# Patient Record
Sex: Male | Born: 1960 | Race: White | Hispanic: No | Marital: Married | Smoking: Never smoker
Health system: Southern US, Community
[De-identification: ages and names within clinical notes are randomized; demographics above are authoritative.]

## PROBLEM LIST (undated history)

## (undated) DIAGNOSIS — R51 Headache: Secondary | ICD-10-CM

## (undated) DIAGNOSIS — R519 Headache, unspecified: Secondary | ICD-10-CM

## (undated) DIAGNOSIS — S3992XA Unspecified injury of lower back, initial encounter: Secondary | ICD-10-CM

## (undated) DIAGNOSIS — G4730 Sleep apnea, unspecified: Secondary | ICD-10-CM

## (undated) DIAGNOSIS — I10 Essential (primary) hypertension: Secondary | ICD-10-CM

## (undated) HISTORY — DX: Headache: R51

## (undated) HISTORY — DX: Sleep apnea, unspecified: G47.30

## (undated) HISTORY — DX: Headache, unspecified: R51.9

## (undated) HISTORY — DX: Unspecified injury of lower back, initial encounter: S39.92XA

## (undated) HISTORY — DX: Essential (primary) hypertension: I10

---

## 2002-10-27 ENCOUNTER — Encounter: Admission: RE | Admit: 2002-10-27 | Discharge: 2002-10-27 | Payer: Self-pay | Admitting: Internal Medicine

## 2002-10-27 ENCOUNTER — Encounter: Payer: Self-pay | Admitting: Internal Medicine

## 2005-07-07 ENCOUNTER — Ambulatory Visit (HOSPITAL_COMMUNITY): Admission: RE | Admit: 2005-07-07 | Discharge: 2005-07-07 | Payer: Self-pay | Admitting: Specialist

## 2005-08-11 ENCOUNTER — Ambulatory Visit (HOSPITAL_BASED_OUTPATIENT_CLINIC_OR_DEPARTMENT_OTHER): Admission: RE | Admit: 2005-08-11 | Discharge: 2005-08-11 | Payer: Self-pay | Admitting: Specialist

## 2005-09-03 ENCOUNTER — Encounter: Admission: RE | Admit: 2005-09-03 | Discharge: 2005-09-03 | Payer: Self-pay | Admitting: Internal Medicine

## 2006-04-28 HISTORY — PX: KNEE SURGERY: SHX244

## 2010-10-27 ENCOUNTER — Emergency Department (HOSPITAL_COMMUNITY): Payer: 59

## 2010-10-27 ENCOUNTER — Emergency Department (HOSPITAL_COMMUNITY)
Admission: EM | Admit: 2010-10-27 | Discharge: 2010-10-27 | Disposition: A | Discharge status: Home / Self Care | Payer: 59 | Attending: Emergency Medicine | Admitting: Emergency Medicine

## 2010-10-27 DIAGNOSIS — R109 Unspecified abdominal pain: Secondary | ICD-10-CM | POA: Insufficient documentation

## 2010-10-27 DIAGNOSIS — N200 Calculus of kidney: Secondary | ICD-10-CM | POA: Insufficient documentation

## 2010-10-27 DIAGNOSIS — N1330 Unspecified hydronephrosis: Secondary | ICD-10-CM | POA: Insufficient documentation

## 2010-10-27 DIAGNOSIS — N201 Calculus of ureter: Secondary | ICD-10-CM | POA: Insufficient documentation

## 2010-10-27 LAB — URINALYSIS, ROUTINE W REFLEX MICROSCOPIC: Specific Gravity, Urine: 1.028 (ref 1.005–1.030)

## 2010-10-27 LAB — URINE MICROSCOPIC-ADD ON

## 2015-02-11 ENCOUNTER — Encounter (HOSPITAL_COMMUNITY): Payer: Self-pay | Admitting: Emergency Medicine

## 2015-02-11 ENCOUNTER — Emergency Department (HOSPITAL_COMMUNITY): Payer: 59

## 2015-02-11 ENCOUNTER — Emergency Department (HOSPITAL_COMMUNITY)
Admission: EM | Admit: 2015-02-11 | Discharge: 2015-02-11 | Disposition: A | Discharge status: Home / Self Care | Payer: 59 | Attending: Emergency Medicine | Admitting: Emergency Medicine

## 2015-02-11 DIAGNOSIS — S199XXA Unspecified injury of neck, initial encounter: Secondary | ICD-10-CM | POA: Insufficient documentation

## 2015-02-11 DIAGNOSIS — S30811A Abrasion of abdominal wall, initial encounter: Secondary | ICD-10-CM | POA: Diagnosis not present

## 2015-02-11 DIAGNOSIS — Y92410 Unspecified street and highway as the place of occurrence of the external cause: Secondary | ICD-10-CM | POA: Insufficient documentation

## 2015-02-11 DIAGNOSIS — M25571 Pain in right ankle and joints of right foot: Secondary | ICD-10-CM

## 2015-02-11 DIAGNOSIS — Y9389 Activity, other specified: Secondary | ICD-10-CM | POA: Insufficient documentation

## 2015-02-11 DIAGNOSIS — Y998 Other external cause status: Secondary | ICD-10-CM | POA: Insufficient documentation

## 2015-02-11 DIAGNOSIS — S99911A Unspecified injury of right ankle, initial encounter: Secondary | ICD-10-CM | POA: Diagnosis not present

## 2015-02-11 DIAGNOSIS — S3992XA Unspecified injury of lower back, initial encounter: Secondary | ICD-10-CM | POA: Insufficient documentation

## 2015-02-11 DIAGNOSIS — M79641 Pain in right hand: Secondary | ICD-10-CM

## 2015-02-11 DIAGNOSIS — M545 Low back pain: Secondary | ICD-10-CM

## 2015-02-11 DIAGNOSIS — M542 Cervicalgia: Secondary | ICD-10-CM

## 2015-02-11 DIAGNOSIS — S6991XA Unspecified injury of right wrist, hand and finger(s), initial encounter: Secondary | ICD-10-CM | POA: Insufficient documentation

## 2015-02-11 MED ORDER — NAPROXEN 500 MG PO TABS: 500.0000 mg | ORAL_TABLET | Freq: Two times a day (BID) | ORAL | Status: DC

## 2015-02-11 MED ORDER — CYCLOBENZAPRINE HCL 10 MG PO TABS: 10.0000 mg | ORAL_TABLET | Freq: Two times a day (BID) | ORAL | Status: DC | PRN
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
## 2015-02-11 NOTE — Discharge Instructions (Signed)
Ankle Pain Ankle pain is a common symptom. The bones, cartilage, tendons, and muscles of the ankle joint perform a lot of work each day. The ankle joint holds your body weight and allows you to move around. Ankle pain can occur on either side or back of 1 or both ankles. Ankle pain may be sharp and burning or dull and aching. There may be tenderness, stiffness, redness, or warmth around the ankle. The pain occurs more often when a person walks or puts pressure on the ankle. CAUSES  There are many reasons ankle pain can develop. It is important to work with your caregiver to identify the cause since many conditions can impact the bones, cartilage, muscles, and tendons. Causes for ankle pain include:  Injury, including a break (fracture), sprain, or strain often due to a fall, sports, or a high-impact activity.  Swelling (inflammation) of a tendon (tendonitis).  Achilles tendon rupture.  Ankle instability after repeated sprains and strains.  Poor foot alignment.  Pressure on a nerve (tarsal tunnel syndrome).  Arthritis in the ankle or the lining of the ankle.  Crystal formation in the ankle (gout or pseudogout). DIAGNOSIS  A diagnosis is based on your medical history, your symptoms, results of your physical exam, and results of diagnostic tests. Diagnostic tests may include X-ray exams or a computerized magnetic scan (magnetic resonance imaging, MRI). TREATMENT  Treatment will depend on the cause of your ankle pain and may include:  Keeping pressure off the ankle and limiting activities.  Using crutches or other walking support (a cane or brace).  Using rest, ice, compression, and elevation.  Participating in physical therapy or home exercises.  Wearing shoe inserts or special shoes.  Losing weight.  Taking medications to reduce pain or swelling or receiving an injection.  Undergoing surgery. HOME CARE INSTRUCTIONS   Only take over-the-counter or prescription medicines for  pain, discomfort, or fever as directed by your caregiver.  Put ice on the injured area.  Put ice in a plastic bag.  Place a towel between your skin and the bag.  Leave the ice on for 15-20 minutes at a time, 03-04 times a day.  Keep your leg raised (elevated) when possible to lessen swelling.  Avoid activities that cause ankle pain.  Follow specific exercises as directed by your caregiver.  Record how often you have ankle pain, the location of the pain, and what it feels like. This information may be helpful to you and your caregiver.  Ask your caregiver about returning to work or sports and whether you should drive.  Follow up with your caregiver for further examination, therapy, or testing as directed. SEEK MEDICAL CARE IF:   Pain or swelling continues or worsens beyond 1 week.  You have an oral temperature above 102 F (38.9 C).  You are feeling unwell or have chills.  You are having an increasingly difficult time with walking.  You have loss of sensation or other new symptoms.  You have questions or concerns. MAKE SURE YOU:   Understand these instructions.  Will watch your condition.  Will get help right away if you are not doing well or get worse.   This information is not intended to replace advice given to you by your health care provider. Make sure you discuss any questions you have with your health care provider.   Document Released: 10/02/2009 Document Revised: 07/07/2011 Document Reviewed: 11/14/2014 Elsevier Interactive Patient Education 2016 [employer]  Back Pain, Adult Back pain is very common  in adults.The cause of back pain is rarely dangerous and the pain often gets better over time.The cause of your back pain may not be known. Some common causes of back pain include:  Strain of the muscles or ligaments supporting the spine.  Wear and tear (degeneration) of the spinal disks.  Arthritis.  Direct injury to the back. For many people, back  pain may return. Since back pain is rarely dangerous, most people can learn to manage this condition on their own. HOME CARE INSTRUCTIONS Watch your back pain for any changes. The following actions may help to lessen any discomfort you are feeling:  Remain active. It is stressful on your back to sit or stand in one place for long periods of time. Do not sit, drive, or stand in one place for more than 30 minutes at a time. Take short walks on even surfaces as soon as you are able.Try to increase the length of time you walk each day.  Exercise regularly as directed by your health care provider. Exercise helps your back heal faster. It also helps avoid future injury by keeping your muscles strong and flexible.  Do not stay in bed.Resting more than 1-2 days can delay your recovery.  Pay attention to your body when you bend and lift. The most comfortable positions are those that put less stress on your recovering back. Always use proper lifting techniques, including:  Bending your knees.  Keeping the load close to your body.  Avoiding twisting.  Find a comfortable position to sleep. Use a firm mattress and lie on your side with your knees slightly bent. If you lie on your back, put a pillow under your knees.  Avoid feeling anxious or stressed.Stress increases muscle tension and can worsen back pain.It is important to recognize when you are anxious or stressed and learn ways to manage it, such as with exercise.  Take medicines only as directed by your health care provider. Over-the-counter medicines to reduce pain and inflammation are often the most helpful.Your health care provider may prescribe muscle relaxant drugs.These medicines help dull your pain so you can more quickly return to your normal activities and healthy exercise.  Apply ice to the injured area:  Put ice in a plastic bag.  Place a towel between your skin and the bag.  Leave the ice on for 20 minutes, 2-3 times a day for  the first 2-3 days. After that, ice and heat may be alternated to reduce pain and spasms.  Maintain a healthy weight. Excess weight puts extra stress on your back and makes it difficult to maintain good posture. SEEK MEDICAL CARE IF:  You have pain that is not relieved with rest or medicine.  You have increasing pain going down into the legs or buttocks.  You have pain that does not improve in one week.  You have night pain.  You lose weight.  You have a fever or chills. SEEK IMMEDIATE MEDICAL CARE IF:   You develop new bowel or bladder control problems.  You have unusual weakness or numbness in your arms or legs.  You develop nausea or vomiting.  You develop abdominal pain.  You feel faint.   This information is not intended to replace advice given to you by your health care provider. Make sure you discuss any questions you have with your health care provider.   Document Released: 04/14/2005 Document Revised: 05/05/2014 Document Reviewed: 08/16/2013 Elsevier Interactive Patient Education 2016 [employer].  Technical brewer It is  common to have multiple bruises and sore muscles after a motor vehicle collision (MVC). These tend to feel worse for the first 24 hours. You may have the most stiffness and soreness over the first several hours. You may also feel worse when you wake up the first morning after your collision. After this point, you will usually begin to improve with each day. The speed of improvement often depends on the severity of the collision, the number of injuries, and the location and nature of these injuries. HOME CARE INSTRUCTIONS  Put ice on the injured area.  Put ice in a plastic bag.  Place a towel between your skin and the bag.  Leave the ice on for 15-20 minutes, 3-4 times a day, or as directed by your health care provider.  Drink enough fluids to keep your urine clear or pale yellow. Do not drink alcohol.  Take a warm shower or bath once  or twice a day. This will increase blood flow to sore muscles.  You may return to activities as directed by your caregiver. Be careful when lifting, as this may aggravate neck or back pain.  Only take over-the-counter or prescription medicines for pain, discomfort, or fever as directed by your caregiver. Do not use aspirin. This may increase bruising and bleeding. SEEK IMMEDIATE MEDICAL CARE IF:  You have numbness, tingling, or weakness in the arms or legs.  You develop severe headaches not relieved with medicine.  You have severe neck pain, especially tenderness in the middle of the back of your neck.  You have changes in bowel or bladder control.  There is increasing pain in any area of the body.  You have shortness of breath, light-headedness, dizziness, or fainting.  You have chest pain.  You feel sick to your stomach (nauseous), throw up (vomit), or sweat.  You have increasing abdominal discomfort.  There is blood in your urine, stool, or vomit.  You have pain in your shoulder (shoulder strap areas).  You feel your symptoms are getting worse. MAKE SURE YOU:  Understand these instructions.  Will watch your condition.  Will get help right away if you are not doing well or get worse.   This information is not intended to replace advice given to you by your health care provider. Make sure you discuss any questions you have with your health care provider.   Document Released: 04/14/2005 Document Revised: 05/05/2014 Document Reviewed: 09/11/2010 Elsevier Interactive Patient Education 2016 Elsevier Inc.  Musculoskeletal Pain Musculoskeletal pain is muscle and boney aches and pains. These pains can occur in any part of the body. Your caregiver may treat you without knowing the cause of the pain. They may treat you if blood or urine tests, X-rays, and other tests were normal.  CAUSES There is often not a definite cause or reason for these pains. These pains may be caused by  a type of germ (virus). The discomfort may also come from overuse. Overuse includes working out too hard when your body is not fit. Boney aches also come from weather changes. Bone is sensitive to atmospheric pressure changes. HOME CARE INSTRUCTIONS   Ask when your test results will be ready. Make sure you get your test results.  Only take over-the-counter or prescription medicines for pain, discomfort, or fever as directed by your caregiver. If you were given medications for your condition, do not drive, operate machinery or power tools, or sign legal documents for 24 hours. Do not drink alcohol. Do not take sleeping pills  or other medications that may interfere with treatment.  Continue all activities unless the activities cause more pain. When the pain lessens, slowly resume normal activities. Gradually increase the intensity and duration of the activities or exercise.  During periods of severe pain, bed rest may be helpful. Lay or sit in any position that is comfortable.  Putting ice on the injured area.  Put ice in a bag.  Place a towel between your skin and the bag.  Leave the ice on for 15 to 20 minutes, 3 to 4 times a day.  Follow up with your caregiver for continued problems and no reason can be found for the pain. If the pain becomes worse or does not go away, it may be necessary to repeat tests or do additional testing. Your caregiver may need to look further for a possible cause. SEEK IMMEDIATE MEDICAL CARE IF:  You have pain that is getting worse and is not relieved by medications.  You develop chest pain that is associated with shortness or breath, sweating, feeling sick to your stomach (nauseous), or throw up (vomit).  Your pain becomes localized to the abdomen.  You develop any new symptoms that seem different or that concern you. MAKE SURE YOU:   Understand these instructions.  Will watch your condition.  Will get help right away if you are not doing well or get  worse.   This information is not intended to replace advice given to you by your health care provider. Make sure you discuss any questions you have with your health care provider.   Document Released: 04/14/2005 Document Revised: 07/07/2011 Document Reviewed: 12/17/2012 Elsevier Interactive Patient Education [employer].

## 2015-02-11 NOTE — ED Notes (Signed)
Discharge instructions/prescription discussed with patient/family. Understanding verbalized. Patient declined wheelchair at time of discharge. No acute distress noted.
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
## 2015-03-01 ENCOUNTER — Ambulatory Visit: Payer: 59 | Attending: Orthopaedic Surgery | Admitting: Physical Therapy

## 2015-03-01 DIAGNOSIS — R293 Abnormal posture: Secondary | ICD-10-CM | POA: Diagnosis present

## 2015-03-01 DIAGNOSIS — M62830 Muscle spasm of back: Secondary | ICD-10-CM | POA: Diagnosis present

## 2015-03-01 DIAGNOSIS — M545 Low back pain, unspecified: Secondary | ICD-10-CM

## 2015-03-01 DIAGNOSIS — M2560 Stiffness of unspecified joint, not elsewhere classified: Secondary | ICD-10-CM | POA: Insufficient documentation

## 2015-03-01 DIAGNOSIS — M5386 Other specified dorsopathies, lumbar region: Secondary | ICD-10-CM

## 2015-03-01 NOTE — Patient Instructions (Signed)
   Edilia Ghuman PT, DPT, LAT, ATC  [facility] Phone: [phone]     
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
## 2015-03-09 ENCOUNTER — Ambulatory Visit: Payer: 59 | Admitting: Physical Therapy

## 2015-03-09 DIAGNOSIS — M545 Low back pain, unspecified: Secondary | ICD-10-CM

## 2015-03-09 DIAGNOSIS — M5386 Other specified dorsopathies, lumbar region: Secondary | ICD-10-CM

## 2015-03-09 DIAGNOSIS — M62830 Muscle spasm of back: Secondary | ICD-10-CM

## 2015-03-09 DIAGNOSIS — R293 Abnormal posture: Secondary | ICD-10-CM

## 2015-03-09 NOTE — Patient Instructions (Addendum)
Sleeping on Back   Place pillow under knees. A pillow with cervical support and a roll around waist are also helpful. Copyright  VHI. All rights reserved.  Sleeping on Side Place pillow between knees. Use cervical support under neck and a roll around waist as needed. Copyright  VHI. All rights reserved.   Sleeping on Stomach   If this is the only desirable sleeping position, place pillow under lower legs, and under stomach or chest as needed.  Posture - Sitting   Sit upright, head facing forward. Try using a roll to support lower back. Keep shoulders relaxed, and avoid rounded back. Keep hips level with knees. Avoid crossing legs for long periods. Stand to Sit / Sit to Stand   To sit: Bend knees to lower self onto front edge of chair, then scoot back on seat. To stand: Reverse sequence by placing one foot forward, and scoot to front of seat. Use rocking motion to stand up.   Work Height and Reach  Ideal work height is no more than 2 to 4 inches below elbow level when standing, and at elbow level when sitting. Reaching should be limited to arm's length, with elbows slightly bent.  Bending  Bend at hips and knees, not back. Keep feet shoulder-width apart.    Posture - Standing   Good posture is important. Avoid slouching and forward head thrust. Maintain curve in low back and align ears over shoul- ders, hips over ankles.  Alternating Positions   Alternate tasks and change positions frequently to reduce fatigue and muscle tension. Take rest breaks. Computer Work   Position work to Programmer, multimedia. Use proper work and seat height. Keep shoulders back and down, wrists straight, and elbows at right angles. Use chair that provides full back support. Add footrest and lumbar roll as needed.  Getting Into / Out of Car  Lower self onto seat, scoot back, then bring in one leg at a time. Reverse sequence to get out.  Dressing  Lie on back to pull socks or slacks over feet, or sit  and bend leg while keeping back straight.    Housework - Sink  Place one foot on ledge of cabinet under sink when standing at sink for prolonged periods.   Pushing / Pulling  Pushing is preferable to pulling. Keep back in proper alignment, and use leg muscles to do the work.  Deep Squat   Squat and lift with both arms held against upper trunk. Tighten stomach muscles without holding breath. Use smooth movements to avoid jerking.  Avoid Twisting   Avoid twisting or bending back. Pivot around using foot movements, and bend at knees if needed when reaching for articles.  Carrying Luggage   Distribute weight evenly on both sides. Use a cart whenever possible. Do not twist trunk. Move body as a unit.   Lifting Principles .Maintain proper posture and head alignment. .Slide object as close as possible before lifting. .Move obstacles out of the way. .Test before lifting; ask for help if too heavy. .Tighten stomach muscles without holding breath. .Use smooth movements; do not jerk. .Use legs to do the work, and pivot with feet. .Distribute the work load symmetrically and close to the center of trunk. .Push instead of pull whenever possible.   Ask For Help   Ask for help and delegate to others when possible. Coordinate your movements when lifting together, and maintain the low back curve.  Log Roll   Lying on back, bend left knee and place  arm across chest. Roll all in one movement to the right. Reverse to roll to the left. Always move as one unit. Housework - Sweeping  Use long-handled equipment to avoid stooping.   Housework - Wiping  Position yourself as close as possible to reach work surface. Avoid straining your back.  Laundry - Unloading Wash   To unload small items at bottom of washer, lift leg opposite to arm being used to reach.  Gardening - Raking  Move close to area to be raked. Use arm movements to do the work. Keep back straight and avoid  twisting.     Cart  When reaching into cart with one arm, lift opposite leg to keep back straight.   Getting Into / Out of Bed  Lower self to lie down on one side by raising legs and lowering head at the same time. Use arms to assist moving without twisting. Bend both knees to roll onto back if desired. To sit up, start from lying on side, and use same move-ments in reverse. Housework - Vacuuming  Hold the vacuum with arm held at side. Step back and forth to move it, keeping head up. Avoid twisting.   Laundry - Loading Wash  Position laundry basket so that bending and twisting can be avoided.   Laundry - Unloading Dryer  Squat down to reach into clothes dryer or use a reacher.  Gardening - Weeding / Planting  Squat or Kneel. Knee pads may be helpful.                    Trigger Point Dry Needling  . What is Trigger Point Dry Needling (DN)? o DN is a physical therapy technique used to treat muscle pain and dysfunction. Specifically, DN helps deactivate muscle trigger points (muscle knots).  o A thin filiform needle is used to penetrate the skin and stimulate the underlying trigger point. The goal is for a local twitch response (LTR) to occur and for the trigger point to relax. No medication of any kind is injected during the procedure.   . What Does Trigger Point Dry Needling Feel Like?  o The procedure feels different for each individual patient. Some patients report that they do not actually feel the needle enter the skin and overall the process is not painful. Very mild bleeding may occur. However, many patients feel a deep cramping in the muscle in which the needle was inserted. This is the local twitch response.   . How Will I feel after the treatment? o Soreness is normal, and the onset of soreness may not occur for a few hours. Typically this soreness does not last longer than two days.  o Bruising is uncommon, however; ice can be used to decrease any  possible bruising.  o In rare cases feeling tired or nauseous after the treatment is normal. In addition, your symptoms may get worse before they get better, this period will typically not last longer than 24 hours.   . What Can I do After My Treatment? o Increase your hydration by drinking more water for the next 24 hours. o You may place ice or heat on the areas treated that have become sore, however, do not use heat on inflamed or bruised areas. Heat often brings more relief post needling. o You can continue your regular activities, but vigorous activity is not recommended initially after the treatment for 24 hours. o DN is best combined with other physical therapy such as strengthening, stretching, and   other therapies.   Adam Hubbard PT, DPT, LAT, ATC  [facility] Phone: [phone]     

## 2015-03-09 NOTE — Therapy (Signed)
[place], [place], [address] Phone: [phone]   Fax:  [phone]  Physical Therapy Treatment  Patient Details  Name: Adam Hubbard MRN: [id number] Date of Birth: 1960-07-17 Referring Provider: Dr. Jean Rosenthal  Encounter Date: 03/09/2015      PT End of Session - 03/09/15 0948    Visit Number 2   Number of Visits 12   Date for PT Re-Evaluation 04/12/15   PT Start Time 0845   PT Stop Time 0940   PT Time Calculation (min) 55 min   Activity Tolerance Patient tolerated treatment well   Behavior During Therapy [facility] for tasks assessed/performed      No past medical history on file.  Past Surgical History  Procedure Laterality Date  . Knee surgery      There were no vitals filed for this visit.  Visit Diagnosis:  Left-sided low back pain without sciatica  Decreased ROM of lumbar spine  Back muscle spasm  Abnormal posture      Subjective Assessment - 03/09/15 0848    Subjective "i am feeling sore today," pt reports that he has been consistent with his HEP   Currently in Pain? Yes   Pain Score 3    Pain Location Back   Pain Orientation Lower;Mid   Pain Descriptors / Indicators Aching;Sharp   Pain Type Chronic pain   Pain Onset More than a month ago   Pain Frequency Intermittent   Aggravating Factors  laying down, or sleeping, bending forward   Pain Relieving Factors nothing seems to help                         [facility] Adult PT Treatment/Exercise - 03/09/15 0859    Self-Care   Self-Care Posture   Posture educated on proper spinal positions and how to maintain proper spinal curves throughout daily activities.    Lumbar Exercises: Stretches   Passive Hamstring Stretch 2 reps;30 seconds   Lower Trunk Rotation 4 reps;20 seconds   Lumbar Exercises: Aerobic   Stationary Bike Nu-Step L5 x 8 min   Lumbar Exercises: Standing   Other Standing Lumbar Exercises rhomboid  stretch from door 2 x 30 sec   Lumbar Exercises: Supine   Ab Set 10 reps;3 seconds   MOIST HEAT PACK Lumbar spine 10 minutes, in supine    Bent Knee Raise 15 reps  VC for abdominal draw in manuever                   Trigger Point Dry Needling - 03/09/15 0917    Consent Given? Yes   Education Handout Provided Yes   Muscles Treated Upper Body --  L lumbar multifidus              PT Education - 03/09/15 0948    Education provided Yes   Education Details dry needling education, posture education    Person(s) Educated Patient   Methods Explanation;Handout   Comprehension Verbalized understanding          PT Short Term Goals - 03/01/15 1220    PT SHORT TERM GOAL #1   Title pt will be I with inital HEP (03/22/2015)   Time 3   Period Weeks   Status New           PT Long Term Goals - 03/01/15 1221    PT LONG TERM GOAL #1   Title pt will be I with all  HEP as of last visit (04/12/2015)   Time 6   Period Weeks   Status New   PT LONG TERM GOAL #2   Title pt will demonstrate decreased low back muscle spasme to assist with improved trunk mobility (04/12/2015)   Time 6   Period Weeks   Status New   PT LONG TERM GOAL #3   Title pt will improve trunk mobility to WNL in all planes with <2/10 pain during and following movement to help with job related activities (04/12/2015)   Time 6   Period Weeks   Status New   PT LONG TERM GOAL #4   Title pt will be able to verbalize and demonstrate techniques to reduce low back pain and reinjury via postural awareness, lifting and carrying mechanics and HEP (04/12/2015)   Time 6   Period Weeks   Status New   PT LONG TERM GOAL #5   Title pt will increase his FOTO score to > 70 to demosntrate improved function at discharge (04/12/2015)   Time [place] - 03/09/15 0949    Clinical Impression Statement Adam Hubbard reports that he has been consistent with his HEP but still has  some low back soreness. He provided consent for dry needling of the L lumbar multifidus which deep ache was elicited and he reported increased trunk mobility after. He stated instrument assisted STM helped decrease the ache and was able to complete all exercises given without complaint of increased pain or symtpoms.    PT Next Visit Plan assess response to dry needling, stretching, core strengthening,  STM for parapsinals, MHP,    PT Home Exercise Plan posture education   Consulted and Agree with Plan of Care Patient        Problem List There are no active problems to display for this patient.  [name] PT, DPT, LAT, ATC  03/09/2015  9:52 AM      [facility] [address] [place], [place], [address] Phone: [phone]   Fax:  [phone]  Name: Adam Hubbard MRN: [id number] Date of Birth: [birth year]

## 2015-03-14 ENCOUNTER — Ambulatory Visit: Payer: 59 | Admitting: Physical Therapy

## 2015-03-14 DIAGNOSIS — M62830 Muscle spasm of back: Secondary | ICD-10-CM

## 2015-03-14 DIAGNOSIS — M5386 Other specified dorsopathies, lumbar region: Secondary | ICD-10-CM

## 2015-03-14 DIAGNOSIS — M545 Low back pain, unspecified: Secondary | ICD-10-CM

## 2015-03-14 DIAGNOSIS — R293 Abnormal posture: Secondary | ICD-10-CM

## 2015-03-14 NOTE — Therapy (Signed)
[place], [place], [address] Phone: [phone]   Fax:  [phone]  Physical Therapy Treatment  Patient Details  Name: Adam Hubbard MRN: [id number] Date of Birth: January 07, 1961 Referring Provider: Dr. Jean Rosenthal  Encounter Date: 03/14/2015      PT End of Session - 03/14/15 0924    Visit Number 3   Number of Visits 12   Date for PT Re-Evaluation 04/12/15   PT Start Time 0804   PT Stop Time 0852   PT Time Calculation (min) 48 min   Activity Tolerance Patient tolerated treatment well;Patient limited by pain  multifitus press painful   Behavior During Therapy [facility] for tasks assessed/performed      No past medical history on file.  Past Surgical History  Procedure Laterality Date  . Knee surgery      There were no vitals filed for this visit.  Visit Diagnosis:  Left-sided low back pain without sciatica  Decreased ROM of lumbar spine  Back muscle spasm  Abnormal posture      Subjective Assessment - 03/14/15 0809    Subjective 3/10 low back Dry needle seemed to help some but pain returned over the weekend.  Sometime s it feels like Pt is helping and then it feels like it is not.  Neck gets stiff too when back is hurting.    Currently in Pain? Yes   Pain Score 3    Pain Location Back   Pain Orientation Lower   Pain Descriptors / Indicators Aching   Pain Frequency Constant  varies  2to 5/10   Aggravating Factors  Back wakes him up.  Manually loading truck.Sitting a loy   Pain Relieving Factors moving around.                          [facility] Adult PT Treatment/Exercise - 03/14/15 0817    Self-Care   Self-Care --  beginning education.  Pamphlet printed but patient left here   Lumbar Exercises: Stretches   Passive Hamstring Stretch 3 reps;30 seconds   Lower Trunk Rotation 4 reps;20 seconds   Lumbar Exercises: Supine   Other Supine Lumbar Exercises Leg lengthener 5  X 5 seconds each side leg press 5 X 5 seconds each, 2 sets.   Lumbar Exercises: Prone   Other Prone Lumbar Exercises multifitus press into 2 pillows increased low back pain so stopped   Manual Therapy   Manual therapy comments Lt paraspinals higher than RT, fascia tight superficial, Rock blade used to help relax low back.  kinesiotex tapind distal to proximal paraspinals.                    PT Short Term Goals - 03/14/15 [id number]    PT SHORT TERM GOAL #1   Title pt will be I with inital HEP (03/22/2015)   Baseline minor cues   Time 3   Period Weeks   Status On-going           PT Long Term Goals - 03/01/15 1221    PT LONG TERM GOAL #1   Title pt will be I with all HEP as of last visit (04/12/2015)   Time 6   Period Weeks   Status New   PT LONG TERM GOAL #2   Title pt will demonstrate decreased low back muscle spasme to assist with improved trunk mobility (04/12/2015)   Time 6   Period Weeks  Status New   PT LONG TERM GOAL #3   Title pt will improve trunk mobility to WNL in all planes with <2/10 pain during and following movement to help with job related activities (04/12/2015)   Time 6   Period Weeks   Status New   PT LONG TERM GOAL #4   Title pt will be able to verbalize and demonstrate techniques to reduce low back pain and reinjury via postural awareness, lifting and carrying mechanics and HEP (04/12/2015)   Time 6   Period Weeks   Status New   PT LONG TERM GOAL #5   Title pt will increase his FOTO score to > 70 to demosntrate improved function at discharge (04/12/2015)   Time [place] - 03/14/15 0925    Clinical Impression Statement Patient stiff.  Needs to get his posture/ADL handout.  Progress toward pain goals temporary.   PT Next Visit Plan Dry Needle?  assess taping, posture handout and review if time, myofascial release   PT Home Exercise Plan continue stretching   Consulted and Agree with Plan of  Care Patient        Problem List There are no active problems to display for this patient.   [facility] 03/14/2015, 9:29 AM  [facility] [address] [place], [place], [address] Phone: [phone]   Fax:  [phone]  Name: Adam Hubbard MRN: [id number] Date of Birth: December 18, 1960    Melvenia Needles, PTA 03/14/2015 9:29 AM Phone: [phone] Fax: [phone]

## 2015-03-14 NOTE — Patient Instructions (Addendum)

## 2015-03-16 ENCOUNTER — Ambulatory Visit: Payer: 59 | Admitting: Physical Therapy

## 2015-03-16 DIAGNOSIS — M62830 Muscle spasm of back: Secondary | ICD-10-CM

## 2015-03-16 DIAGNOSIS — M545 Low back pain, unspecified: Secondary | ICD-10-CM

## 2015-03-16 DIAGNOSIS — R293 Abnormal posture: Secondary | ICD-10-CM

## 2015-03-16 DIAGNOSIS — M5386 Other specified dorsopathies, lumbar region: Secondary | ICD-10-CM

## 2015-03-16 NOTE — Therapy (Signed)
[place], [place], [address] Phone: [phone]   Fax:  [phone]  Physical Therapy Treatment  Patient Details  Name: Adam Hubbard MRN: [id number] Date of Birth: 1960/12/25 Referring Provider: Dr. Jean Rosenthal  Encounter Date: 03/16/2015      PT End of Session - 03/16/15 0837    Visit Number 4   Number of Visits 12   Date for PT Re-Evaluation 04/12/15   PT Start Time 0800   PT Stop Time 0900   PT Time Calculation (min) 60 min   Activity Tolerance Patient tolerated treatment well   Behavior During Therapy [facility] for tasks assessed/performed      No past medical history on file.  Past Surgical History  Procedure Laterality Date  . Knee surgery      There were no vitals filed for this visit.  Visit Diagnosis:  Decreased ROM of lumbar spine  Left-sided low back pain without sciatica  Back muscle spasm  Abnormal posture      Subjective Assessment - 03/16/15 0803    Subjective "Today things are going pretty good, still have some tightness but today its more mild" pt reports it is difficult to tell if the tape has helped.    Currently in Pain? Yes   Pain Score 3    Pain Location Back   Pain Orientation Lower   Pain Descriptors / Indicators Aching   Pain Type Chronic pain                         [facility] Adult PT Treatment/Exercise - 03/16/15 0001    Lumbar Exercises: Stretches   Passive Hamstring Stretch 3 reps;30 seconds  PNF contract relax with 10 sec contraction   Lower Trunk Rotation 3 reps;20 seconds   Lumbar Exercises: Aerobic   Stationary Bike Nu-Step L5 x 8 min   Lumbar Exercises: Standing   Other Standing Lumbar Exercises rhomboid stretch from door 2 x 30 sec   Lumbar Exercises: Supine   Bent Knee Raise 15 reps  with abdominal draw in manuever   Lumbar Exercises: Prone   Opposite Arm/Leg Raise Right arm/Left leg;Left arm/Right leg;10 reps  with  transverse abdominis contraction, on stomach   Other Prone Lumbar Exercises --   Modalities   Modalities Moist Heat;Electrical Stimulation   Moist Heat Therapy   Number Minutes Moist Heat 15 Minutes   Moist Heat Location Lumbar Spine  in supine   Manual Therapy   Manual Therapy Joint mobilization;Myofascial release;Soft tissue mobilization   Joint Mobilization grade 2-3 lumbar P>A joint mobs, and unilateral mobs   Soft tissue mobilization instrument assisted STM over bil lumbar parapsinals   Myofascial Release slow myofascial stretchinging with cross arm techniques          Trigger Point Dry Needling - 03/16/15 0805    Consent Given? Yes   Education Handout Provided Yes   Muscles Treated Upper Body Longissimus  lumbar multifiuds   Longissimus Response Twitch response elicited;Palpable increased muscle length              PT Education - 03/16/15 0837    Education provided Yes   Education Details e-stim   Person(s) Educated Patient   Methods Explanation   Comprehension Verbalized understanding          PT Short Term Goals - 03/14/15 0928    PT SHORT TERM GOAL #1   Title pt will be I with inital HEP (  03/22/2015)   Baseline minor cues   Time 3   Period Weeks   Status On-going           PT Long Term Goals - 03/01/15 1221    PT LONG TERM GOAL #1   Title pt will be I with all HEP as of last visit (04/12/2015)   Time 6   Period Weeks   Status New   PT LONG TERM GOAL #2   Title pt will demonstrate decreased low back muscle spasme to assist with improved trunk mobility (04/12/2015)   Time 6   Period Weeks   Status New   PT LONG TERM GOAL #3   Title pt will improve trunk mobility to WNL in all planes with <2/10 pain during and following movement to help with job related activities (04/12/2015)   Time 6   Period Weeks   Status New   PT LONG TERM GOAL #4   Title pt will be able to verbalize and demonstrate techniques to reduce low back pain and reinjury  via postural awareness, lifting and carrying mechanics and HEP (04/12/2015)   Time 6   Period Weeks   Status New   PT LONG TERM GOAL #5   Title pt will increase his FOTO score to > 70 to demosntrate improved function at discharge (04/12/2015)   Time [place] - 03/16/15 0838    Clinical Impression Statement Micahel reports some that he is doing better today. peformed dry needling on the L lumbar multifdus and paraspinals, deep ache was elicited and pt rpeorted some decreased tension. He completed his exercises with only minimal complaint of tightness during prone on stomach multifidus activation exercises. Following todays session he reported  decreaed pain and stiffness in the low back.    PT Next Visit Plan assess response to Dry Needle, myofascial release, stretching of hamstrings and lumbar spine, core strengthening.    PT Home Exercise Plan reviewed stretching HEP   Consulted and Agree with Plan of Care Patient        Problem List There are no active problems to display for this patient.  [name] PT, DPT, LAT, ATC  03/16/2015  9:01 AM     [facility] [address] [place], [place], [address] Phone: [phone]   Fax:  [phone]  Name: Adam Hubbard MRN: [id number] Date of Birth: Dec 23, 1960

## 2015-03-19 ENCOUNTER — Encounter: Payer: 59 | Admitting: Physical Therapy

## 2015-03-21 ENCOUNTER — Ambulatory Visit: Payer: 59 | Admitting: Physical Therapy

## 2015-03-21 DIAGNOSIS — R293 Abnormal posture: Secondary | ICD-10-CM

## 2015-03-21 DIAGNOSIS — M545 Low back pain, unspecified: Secondary | ICD-10-CM

## 2015-03-21 DIAGNOSIS — M62830 Muscle spasm of back: Secondary | ICD-10-CM

## 2015-03-21 NOTE — Therapy (Signed)
[place], [place], [address] Phone: [phone]   Fax:  [phone]  Physical Therapy Treatment  Patient Details  Name: Adam Hubbard MRN: [id number] Date of Birth: May 26, 1960 Referring Provider: Dr. Jean Rosenthal  Encounter Date: 03/21/2015      PT End of Session - 03/21/15 1230    Visit Number 5   Number of Visits 12   Date for PT Re-Evaluation 04/12/15   PT Start Time 0731   PT Stop Time 0800   PT Time Calculation (min) 29 min   Activity Tolerance Patient tolerated treatment well   Behavior During Therapy [facility] for tasks assessed/performed      No past medical history on file.  Past Surgical History  Procedure Laterality Date  . Knee surgery      There were no vitals filed for this visit.  Visit Diagnosis:  Left-sided low back pain without sciatica  Back muscle spasm  Abnormal posture      Subjective Assessment - 03/21/15 0734    Subjective Had to help move his daughter on SAT.  Not sure if Dry needle helped a lot.  (A lot of steps, furniture not real heavy)  Saw Ortho MD yesterday.  He is to continue with  PT.  To call MD to schedule if needed.   To take naproxen .  Manual helpful  would like to try it again.     Currently in Pain? Yes   Pain Score 3    Pain Location Back   Pain Orientation Lower   Pain Descriptors / Indicators Aching   Pain Frequency Constant   Aggravating Factors  Being still a long time sitting.     Pain Relieving Factors getting up.                         [place] Adult PT Treatment/Exercise - 03/21/15 0730    Moist Heat Therapy   Number Minutes Moist Heat 15 Minutes   Moist Heat Location Lumbar Spine  prone   Manual Therapy   Soft tissue mobilization instrument assist , rock blade, to low back Lt> RT  tissue stiff initially then softened with manual.fascial stretch after tissue softened.                    PT Short Term  Goals - 03/21/15 1236    PT SHORT TERM GOAL #1   Title pt will be I with inital HEP (03/22/2015)   Baseline reports no problems   Time 3   Period Weeks   Status On-going           PT Long Term Goals - 03/21/15 1236    PT LONG TERM GOAL #1   Title pt will be I with all HEP as of last visit (04/12/2015)   Time 6   Period Weeks   Status On-going   PT LONG TERM GOAL #2   Title pt will demonstrate decreased low back muscle spasme to assist with improved trunk mobility (04/12/2015)   Time 6   Period Weeks   Status Unable to assess   PT LONG TERM GOAL #3   Title pt will improve trunk mobility to WNL in all planes with <2/10 pain during and following movement to help with job related activities (04/12/2015)   Time 6   Period Weeks   Status Unable to assess   PT LONG TERM GOAL #4   Title pt  will be able to verbalize and demonstrate techniques to reduce low back pain and reinjury via postural awareness, lifting and carrying mechanics and HEP (04/12/2015)   Baseline using at home   Time 6   Period Weeks   Status On-going   PT LONG TERM GOAL #5   Title pt will increase his FOTO score to > 70 to demosntrate improved function at discharge (04/12/2015)   Time 6   Period Weeks   Status Unable to assess               Plan - 03/21/15 1233    Clinical Impression Statement Moving his daughter may have undone the good of the dry needle.  Tape not re applied due to red skin at sacrum. (He said it did not itch)     PT Next Visit Plan try multifitus stabilization/  Continue to stretch.   PT Home Exercise Plan stretch, exercise, use good body mechanics.   Consulted and Agree with Plan of Care Patient        Problem List There are no active problems to display for this patient.   [facility] 03/21/2015, 12:39 PM  [facility] [address] [place], [place], [address] Phone: [phone]   Fax:  [phone]  Name: THAYNE OKIN MRN: [id number] Date of Birth: 07-02-1960    Melvenia Needles, PTA 03/21/2015 12:39 PM Phone: [phone] Fax: [phone]

## 2015-03-27 ENCOUNTER — Ambulatory Visit: Payer: 59 | Admitting: Physical Therapy

## 2015-03-27 DIAGNOSIS — R293 Abnormal posture: Secondary | ICD-10-CM

## 2015-03-27 DIAGNOSIS — M62830 Muscle spasm of back: Secondary | ICD-10-CM

## 2015-03-27 DIAGNOSIS — M545 Low back pain, unspecified: Secondary | ICD-10-CM

## 2015-03-27 DIAGNOSIS — M5386 Other specified dorsopathies, lumbar region: Secondary | ICD-10-CM

## 2015-03-27 NOTE — Therapy (Signed)
[place], [place], [address] Phone: [phone]   Fax:  [phone]  Physical Therapy Treatment  Patient Details  Name: Adam Hubbard MRN: [id number] Date of Birth: [birth year] Referring Provider: Dr. Jean Rosenthal  Encounter Date: 03/27/2015      PT End of Session - 03/27/15 0931    Visit Number 6   Number of Visits 12   Date for PT Re-Evaluation 04/12/15   PT Start Time 0845   PT Stop Time 0938   PT Time Calculation (min) 53 min   Activity Tolerance Patient tolerated treatment well   Behavior During Therapy [facility] for tasks assessed/performed      No past medical history on file.  Past Surgical History  Procedure Laterality Date  . Knee surgery      There were no vitals filed for this visit.  Visit Diagnosis:  Left-sided low back pain without sciatica  Back muscle spasm  Abnormal posture  Decreased ROM of lumbar spine      Subjective Assessment - 03/27/15 0850    Subjective "I can't tell if the needling is helping, it isn't hurting anything but I can't tell"  pt reports pain to be at a 3/10 today with it fluctuating more lately   Currently in Pain? Yes   Pain Score 3    Pain Location Back   Pain Orientation Lower   Pain Descriptors / Indicators Aching   Pain Type Chronic pain   Pain Onset More than a month ago   Pain Frequency Constant   Aggravating Factors  being still and sittiong for long periods of time   Pain Relieving Factors getting up/ moving around.                          [place] Adult PT Treatment/Exercise - 03/27/15 0853    Lumbar Exercises: Stretches   Active Hamstring Stretch 4 reps;30 seconds  PNF contract relax with 10 sec contraction   Lower Trunk Rotation 2 reps;30 seconds   Lumbar Exercises: Aerobic   Stationary Bike Nu-Step L7 x 8 min   Lumbar Exercises: Standing   Other Standing Lumbar Exercises rhomboid stretch from door 3 x 30 sec   with rotation to L & R   Moist Heat Therapy   Number Minutes Moist Heat 10 Minutes   Moist Heat Location Lumbar Spine  in supine   Electrical Stimulation   Electrical Stimulation Location low back    Electrical Stimulation Action pre mod   Electrical Stimulation Parameters L13 x 10 min   Electrical Stimulation Goals Pain   Manual Therapy   Joint Mobilization grade 3 lumbar P>A joint mobs, and bil rotational mobs in sidelying   Soft tissue mobilization instrument assist , rock blade, to low back Lt> RT  tissue stiff initially then softened with manual.fascial stretch after tissue softened.     Myofascial Release slow myofascial stretchinging with cross arm techniques                PT Education - 03/27/15 0931    Education provided Yes   Education Details reviewed HEP, and Plan for next visit   Person(s) Educated Patient   Methods Explanation   Comprehension Verbalized understanding          PT Short Term Goals - 03/21/15 1236    PT SHORT TERM GOAL #1   Title pt will be I with inital HEP (03/22/2015)   Baseline  reports no problems   Time 3   Period Weeks   Status On-going           PT Long Term Goals - 03/21/15 1236    PT LONG TERM GOAL #1   Title pt will be I with all HEP as of last visit (04/12/2015)   Time 6   Period Weeks   Status On-going   PT LONG TERM GOAL #2   Title pt will demonstrate decreased low back muscle spasme to assist with improved trunk mobility (04/12/2015)   Time 6   Period Weeks   Status Unable to assess   PT LONG TERM GOAL #3   Title pt will improve trunk mobility to WNL in all planes with <2/10 pain during and following movement to help with job related activities (04/12/2015)   Time 6   Period Weeks   Status Unable to assess   PT LONG TERM GOAL #4   Title pt will be able to verbalize and demonstrate techniques to reduce low back pain and reinjury via postural awareness, lifting and carrying mechanics and HEP (04/12/2015)    Baseline using at home   Time 6   Period Weeks   Status On-going   PT LONG TERM GOAL #5   Title pt will increase his FOTO score to > 70 to demosntrate improved function at discharge (04/12/2015)   Time 6   Period Weeks   Status Unable to assess               Plan - 03/27/15 0931    Clinical Impression Statement Adam Hubbard continues to demonstrate significant tightness and lumbar paraspinal spasm. Focused todays treatment on manual to calm down tightness and improved on trunk mobility. He reported some relief of pain during mobs, and instrument assisted STM over the parapsinals. discussed using E-stim to fatigue the muscles in the low back and may attempt next visit if no improvements were made.    PT Next Visit Plan try multifitus stabilization/  Continue to stretch. E-stim twitch to fatigue muscle?   PT Home Exercise Plan HEP review   Consulted and Agree with Plan of Care Patient        Problem List There are no active problems to display for this patient.  [name] PT, DPT, LAT, ATC  03/27/2015  12:58 PM    [facility] [address] [place], [place], [address] Phone: [phone]   Fax:  [phone]  Name: Adam Hubbard MRN: [id number] Date of Birth: 05-01-1960

## 2015-03-29 ENCOUNTER — Ambulatory Visit: Payer: 59 | Attending: Orthopaedic Surgery | Admitting: Physical Therapy

## 2015-03-29 DIAGNOSIS — M62830 Muscle spasm of back: Secondary | ICD-10-CM

## 2015-03-29 DIAGNOSIS — M2560 Stiffness of unspecified joint, not elsewhere classified: Secondary | ICD-10-CM | POA: Diagnosis present

## 2015-03-29 DIAGNOSIS — M545 Low back pain, unspecified: Secondary | ICD-10-CM

## 2015-03-29 DIAGNOSIS — R293 Abnormal posture: Secondary | ICD-10-CM | POA: Diagnosis present

## 2015-03-29 DIAGNOSIS — M5386 Other specified dorsopathies, lumbar region: Secondary | ICD-10-CM

## 2015-03-29 NOTE — Therapy (Signed)
Adam Hubbard, [place], [address] Phone: [phone]   Fax:  [phone]  Physical Therapy Treatment  Patient Details  Name: Adam Hubbard MRN: [id number] Date of Birth: 12-Aug-1960 Referring Provider: Dr. Jean Hubbard  Encounter Date: 03/29/2015      PT End of Session - 03/29/15 1006    Visit Number 7   Number of Visits 12   Date for PT Re-Evaluation 04/12/15   PT Start Time 0930   PT Stop Time 1025   PT Time Calculation (min) 55 min   Activity Tolerance Patient tolerated treatment well   Behavior During Therapy [facility] for tasks assessed/performed      No past medical history on file.  Past Surgical History  Procedure Laterality Date  . Knee surgery      There were no vitals filed for this visit.  Visit Diagnosis:  Left-sided low back pain without sciatica  Back muscle spasm  Abnormal posture  Decreased ROM of lumbar spine      Subjective Assessment - 03/29/15 0932    Subjective "I am feeling more soreness today and it has been the same since the last visit.    Currently in Pain? Yes   Pain Score 5    Pain Location Back   Pain Orientation Lower   Pain Descriptors / Indicators Aching;Sore   Pain Type Chronic pain   Pain Onset More than a month ago   Pain Frequency Constant   Aggravating Factors  being still for too long   Pain Relieving Factors getting up/ moving around.                         [place] Adult PT Treatment/Exercise - 03/29/15 0001    Lumbar Exercises: Stretches   Passive Hamstring Stretch 2 reps;30 seconds   Lower Trunk Rotation 3 reps;30 seconds   Lumbar Exercises: Aerobic   Stationary Bike Nu-Step L7 x 8 min   Lumbar Exercises: Standing   Other Standing Lumbar Exercises rhomboid stretch from door 3 x 30 sec   Lumbar Exercises: Prone   Other Prone Lumbar Exercises multifitus press into 2 pillows increased low back pain so stopped   Moist Heat  Therapy   Number Minutes Moist Heat 15 Minutes   Moist Heat Location Lumbar Spine  in supine   Electrical Stimulation   Electrical Stimulation Location low back  focused on L lower thoracic/ upper lumbar parapsinals   Electrical Stimulation Action IFC   Electrical Stimulation Parameters 100% scan x 15 min, L 10   Electrical Stimulation Goals Pain   Manual Therapy   Joint Mobilization grade 3 lumbar P>A joint mobs   Soft tissue mobilization instrument assist , rock blade, to low back Lt> RT  tissue stiff initially then softened with manual.fascial stretch after tissue softened.     Myofascial Release slow myofascial stretchinging with cross arm techniques          Trigger Point Dry Needling - 03/29/15 0933    Consent Given? Yes   Education Handout Provided Yes   Muscles Treated Upper Body Longissimus  L lumbar/ lower thoracic   Longissimus Response Twitch response elicited;Palpable increased muscle length              PT Education - 03/29/15 1006    Education provided Yes   Education Details HEP review          PT Short Term Goals - 03/21/15 1236  PT SHORT TERM GOAL #1   Title pt will be I with inital HEP (03/22/2015)   Baseline reports no problems   Time 3   Period Weeks   Status On-going           PT Long Term Goals - 03/21/15 1236    PT LONG TERM GOAL #1   Title pt will be I with all HEP as of last visit (04/12/2015)   Time 6   Period Weeks   Status On-going   PT LONG TERM GOAL #2   Title pt will demonstrate decreased low back muscle spasme to assist with improved trunk mobility (04/12/2015)   Time 6   Period Weeks   Status Unable to assess   PT LONG TERM GOAL #3   Title pt will improve trunk mobility to WNL in all planes with <2/10 pain during and following movement to help with job related activities (04/12/2015)   Time 6   Period Weeks   Status Unable to assess   PT LONG TERM GOAL #4   Title pt will be able to verbalize and demonstrate  techniques to reduce low back pain and reinjury via postural awareness, lifting and carrying mechanics and HEP (04/12/2015)   Baseline using at home   Time 6   Period Weeks   Status On-going   PT LONG TERM GOAL #5   Title pt will increase his FOTO score to > 70 to demosntrate improved function at discharge (04/12/2015)   Time 6   Period Weeks   Status Unable to assess               Plan - 03/29/15 1006    Clinical Impression Statement Adam Hubbard reports that he feels more soreness since the last session but notes that it is higher than where is pain/ tightness typically is. following DN of the L thoracic/ upper lumbar region relased some tension and tightness. He complete all exercises given today with minimal complaint of tightness. opted for heat and e-stim to help address any additional sorenss following todays session.    PT Next Visit Plan multifitus stabilization/  Continue to stretch. E-stim twitch to fatigue muscle?, Dry needling if pt deems beneficial   PT Home Exercise Plan HEP review   Consulted and Agree with Plan of Care Patient        Problem List There are no active problems to display for this patient.  Adam Hubbard PT, DPT, LAT, ATC  03/29/2015  10:27 AM     [facility] [address] [place], [place], [address] Phone: [phone]   Fax:  [phone]  Name: Adam Hubbard MRN: [id number] Date of Birth: 1961-03-05

## 2015-04-04 ENCOUNTER — Ambulatory Visit: Payer: 59 | Admitting: Physical Therapy

## 2015-04-04 DIAGNOSIS — M545 Low back pain, unspecified: Secondary | ICD-10-CM

## 2015-04-04 DIAGNOSIS — R293 Abnormal posture: Secondary | ICD-10-CM

## 2015-04-04 DIAGNOSIS — M5386 Other specified dorsopathies, lumbar region: Secondary | ICD-10-CM

## 2015-04-04 DIAGNOSIS — M62830 Muscle spasm of back: Secondary | ICD-10-CM

## 2015-04-04 NOTE — Therapy (Signed)
[place], [place], [address] Phone: [phone]   Fax:  [phone]  Physical Therapy Treatment  Patient Details  Name: Adam Hubbard MRN: [id number] Date of Birth: 12/07/1960 Referring Provider: Dr. Jean Rosenthal  Encounter Date: 04/04/2015      PT End of Session - 04/04/15 0947    Visit Number 8   Date for PT Re-Evaluation 04/12/15   PT Start Time 0805   PT Stop Time 0845   PT Time Calculation (min) 40 min   Activity Tolerance Patient tolerated treatment well;No increased pain   Behavior During Therapy [facility] for tasks assessed/performed      No past medical history on file.  Past Surgical History  Procedure Laterality Date  . Knee surgery      There were no vitals filed for this visit.  Visit Diagnosis:  Left-sided low back pain without sciatica  Back muscle spasm  Abnormal posture  Decreased ROM of lumbar spine      Subjective Assessment - 04/04/15 0809    Subjective 3/10.  Was sore after Dry Needle the felt better.     Currently in Pain? Yes   Pain Score 3    Pain Location Back   Pain Orientation Lower;Mid   Pain Descriptors / Indicators --  stiff in am   Aggravating Factors  first thing in am.   Pain Relieving Factors lower trunk rotation.                           [place] Adult PT Treatment/Exercise - 04/04/15 0811    Lumbar Exercises: Stretches   Quadruped Mid Back Stretch 3 reps  cat, camel painful so went to neutral, childs pose 3  ways   Piriformis Stretch 3 reps;30 seconds   Piriformis Stretch Limitations very stiff   Lumbar Exercises: Aerobic   Stationary Bike Nu-Step L7, 10 minutes   Lumbar Exercises: Prone   Other Prone Lumbar Exercises 3 pillows, leg lift 5 X                PT Education - 04/04/15 0829    Education provided Yes   Education Details Multifitus , piriformis, hip stretches   Person(s) Educated Patient   Methods  Explanation;Tactile cues;Verbal cues;Handout   Comprehension Verbalized understanding;Returned demonstration          PT Short Term Goals - 04/04/15 0950    PT SHORT TERM GOAL #1   Title pt will be I with inital HEP (03/22/2015)   Baseline able to recite entire program   Time 3   Period Weeks   Status Achieved           PT Long Term Goals - 04/04/15 [id number]    PT LONG TERM GOAL #1   Title pt will be I with all HEP as of last visit (04/12/2015)   Time 6   Period Weeks   Status On-going   PT LONG TERM GOAL #2   Title pt will demonstrate decreased low back muscle spasme to assist with improved trunk mobility (04/12/2015)   Time 6   Period Weeks   Status On-going   PT LONG TERM GOAL #3   Title pt will improve trunk mobility to WNL in all planes with <2/10 pain during and following movement to help with job related activities (04/12/2015)   Time 6   Period Weeks   Status Unable to assess   PT LONG TERM  GOAL #4   Title pt will be able to verbalize and demonstrate techniques to reduce low back pain and reinjury via postural awareness, lifting and carrying mechanics and HEP (04/12/2015)   Time 6   Period Weeks   Status On-going   PT LONG TERM GOAL #5   Title pt will increase his FOTO score to > 70 to demosntrate improved function at discharge (04/12/2015)   Time 6   Period Weeks   Status Unable to assess               Plan - 04/04/15 0948    Clinical Impression Statement Mr Ramones reported not being able to get his RT scok off easily.  RT hip stiff with decreased ROM,  RT heel improved from reaching LT mid shin to just above his LT knee post session.  Progress toward home exercise goals.    PT Next Visit Plan review stabilization, piriformis stretches.   PT Home Exercise Plan multifitus, piriformis   Consulted and Agree with Plan of Care Patient        Problem List There are no active problems to display for this patient.   [facility] 04/04/2015, 9:52  AM  [facility] [address] [place], [place], [address] Phone: [phone]   Fax:  [phone]  Name: Adam Hubbard MRN: [id number] Date of Birth: Jun 17, 1960    Melvenia Needles, PTA 04/04/2015 9:52 AM Phone: [phone] Fax: [phone]

## 2015-04-04 NOTE — Patient Instructions (Addendum)
Multifitus, press, leg lift, with knee flexion.  From exercise drawer.Piriformis Stretch    Lying on back, pull right knee toward opposite shoulder. Hold _30___ seconds. Repeat _1-2___ times. Do _1-2___ sessions per day.  http://gt2.exer.us/257   Copyright  VHI. All rights reserved.  Hip Stretch    Put right ankle over left knee. Let right knee fall downward, but keep ankle in place. Feel the stretch in hip. May push down gently with hand to feel stretch. Hold _30___ seconds while counting out loud. Repeat with other leg. Repeat __3__ times. Do __1-2__ sessions per day.  http://gt2.exer.us/497   Copyright  VHI. All rights reserved.

## 2015-04-06 ENCOUNTER — Ambulatory Visit: Payer: 59 | Admitting: Physical Therapy

## 2015-04-06 DIAGNOSIS — M62830 Muscle spasm of back: Secondary | ICD-10-CM

## 2015-04-06 DIAGNOSIS — M545 Low back pain, unspecified: Secondary | ICD-10-CM

## 2015-04-06 DIAGNOSIS — R293 Abnormal posture: Secondary | ICD-10-CM

## 2015-04-06 DIAGNOSIS — M5386 Other specified dorsopathies, lumbar region: Secondary | ICD-10-CM

## 2015-04-06 NOTE — Therapy (Signed)
[place], [place], [address] Phone: [phone]   Fax:  [phone]  Physical Therapy Treatment  Patient Details  Name: Adam Hubbard MRN: [id number] Date of Birth: Sep 03, 1960 Referring Provider: Dr. Jean Rosenthal  Encounter Date: 04/06/2015      PT End of Session - 04/06/15 0923    Visit Number 9   Number of Visits 12   Date for PT Re-Evaluation 04/12/15   PT Start Time 0845   PT Stop Time 0932   PT Time Calculation (min) 47 min   Activity Tolerance Patient tolerated treatment well   Behavior During Therapy [facility] for tasks assessed/performed      No past medical history on file.  Past Surgical History  Procedure Laterality Date  . Knee surgery      There were no vitals filed for this visit.  Visit Diagnosis:  Left-sided low back pain without sciatica  Back muscle spasm  Decreased ROM of lumbar spine  Abnormal posture      Subjective Assessment - 04/06/15 0806    Subjective "I feel like I am doing better but the tightness continues to fluctuate depending on what I am doing"    Currently in Pain? Yes   Pain Score 3    Pain Location Back   Pain Orientation Lower   Pain Descriptors / Indicators Tightness   Pain Type Chronic pain   Pain Onset More than a month ago   Pain Frequency Intermittent   Aggravating Factors  prolonged sitting,                          [facility] Adult PT Treatment/Exercise - 04/06/15 0808    Lumbar Exercises: Stretches   Active Hamstring Stretch 4 reps;30 seconds  PNF contract/ relax with 10 sec hold   Double Knee to Chest Stretch 2 reps;30 seconds   Lower Trunk Rotation 3 reps;30 seconds   Quadruped Mid Back Stretch 2 reps;30 seconds   Piriformis Stretch 3 reps;30 seconds   Lumbar Exercises: Aerobic   Stationary Bike Nu-Step L7, 10 minutes   Lumbar Exercises: Quadruped   Madcat/Old Horse 15 reps  x 1 set with 5 sec hold   Electrical  Stimulation   Electrical Stimulation Location low back   Electrical Stimulation Action IFC    Electrical Stimulation Parameters pusle rate 200 fixed, Level 40, x 15 min  to elicit twitch response to decreased Soil scientist Goals Other (comment)  decreased  muscle tension   Manual Therapy   Joint Mobilization grade 3 lumbar P>A joint mobs, grade 3 R hip distraction and A>P with belt   Soft tissue mobilization instrument assist , rock blade, to low back Lt> RT  tissue stiff initially then softened with manual.fascial stretch after tissue softened.     Myofascial Release slow myofascial stretchinging with cross arm techniques                PT Education - 04/06/15 0922    Education provided Yes   Education Details e-stim to elicit a twitch response to fatigue muscle   Person(s) Educated Patient   Methods Explanation   Comprehension Verbalized understanding          PT Short Term Goals - 04/04/15 0950    PT SHORT TERM GOAL #1   Title pt will be I with inital HEP (03/22/2015)   Baseline able to recite entire program   Time 3  Period Weeks   Status Achieved           PT Long Term Goals - 04/04/15 [id number]    PT LONG TERM GOAL #1   Title pt will be I with all HEP as of last visit (04/12/2015)   Time 6   Period Weeks   Status On-going   PT LONG TERM GOAL #2   Title pt will demonstrate decreased low back muscle spasme to assist with improved trunk mobility (04/12/2015)   Time 6   Period Weeks   Status On-going   PT LONG TERM GOAL #3   Title pt will improve trunk mobility to WNL in all planes with <2/10 pain during and following movement to help with job related activities (04/12/2015)   Time 6   Period Weeks   Status Unable to assess   PT LONG TERM GOAL #4   Title pt will be able to verbalize and demonstrate techniques to reduce low back pain and reinjury via postural awareness, lifting and carrying mechanics and HEP (04/12/2015)   Time 6    Period Weeks   Status On-going   PT LONG TERM GOAL #5   Title pt will increase his FOTO score to > 70 to demosntrate improved function at discharge (04/12/2015)   Time 6   Period Weeks   Status Unable to assess               Plan - 04/06/15 0923    Clinical Impression Statement Vivian continues to report tightness in the low back that seems to flucutate in severity depending on what activities he has done. he reported some relief of symptoms followint DTM and mobs in the lumbar spine and in the  R hip to increase mobility. Attempted to utilize e-stim to elicit a twitch response to fatigue the muscle of the low back, pt reported decreased pain and tightness following todays session. Montiored pt's response closely during E-stim for pain or discomfort.    PT Next Visit Plan review stabilization, piriformis stretches, hip mobs, Manual for low back, Dry needling PRn   Consulted and Agree with Plan of Care Patient        Problem List There are no active problems to display for this patient.  [name] PT, DPT, LAT, ATC  04/06/2015  9:27 AM    [facility] [address] [place], [place], [address] Phone: [phone]   Fax:  [phone]  Name: Adam Hubbard MRN: [id number] Date of Birth: Oct 13, 1960

## 2015-04-11 ENCOUNTER — Ambulatory Visit: Payer: 59 | Admitting: Physical Therapy

## 2015-04-11 DIAGNOSIS — M545 Low back pain, unspecified: Secondary | ICD-10-CM

## 2015-04-11 DIAGNOSIS — M62830 Muscle spasm of back: Secondary | ICD-10-CM

## 2015-04-11 DIAGNOSIS — R293 Abnormal posture: Secondary | ICD-10-CM

## 2015-04-11 DIAGNOSIS — M5386 Other specified dorsopathies, lumbar region: Secondary | ICD-10-CM

## 2015-04-11 NOTE — Patient Instructions (Signed)
Wall Sit    Back against wall, slide down so knees challanged. Hold _10 to 30___ seconds. Do _1___ sets. Complete _3 to 10 ___ repetitions.  http://st.exer.us/294   Copyright  VHI. All rights reserved.

## 2015-04-11 NOTE — Therapy (Signed)
[place], [place], [address] Phone: [phone]   Fax:  [phone]  Physical Therapy Treatment  Patient Details  Name: Adam Hubbard MRN: [id number] Date of Birth: June 14, 1960 Referring Provider: Dr. Jean Rosenthal  Encounter Date: 04/11/2015      PT End of Session - 04/11/15 0845    Visit Number 10   Number of Visits 12   Date for PT Re-Evaluation 04/12/15   PT Start Time 0800   PT Stop Time 0846   PT Time Calculation (min) 46 min   Activity Tolerance Patient tolerated treatment well;No increased pain   Behavior During Therapy [facility] for tasks assessed/performed      No past medical history on file.  Past Surgical History  Procedure Laterality Date  . Knee surgery      There were no vitals filed for this visit.  Visit Diagnosis:  Left-sided low back pain without sciatica  Back muscle spasm  Decreased ROM of lumbar spine  Abnormal posture      Subjective Assessment - 04/11/15 0810    Subjective Did my stretches before geting out og bed.  Went to reach under thwe sink and had a sharp pain in low back.     Currently in Pain? Yes   Pain Score 4    Pain Location Back   Pain Orientation Lower   Pain Descriptors / Indicators Sharp   Pain Frequency Intermittent   Aggravating Factors  reaching under sink with legs straight.   Pain Relieving Factors stretches                         [facility] Adult PT Treatment/Exercise - 04/11/15 0814    Lumbar Exercises: Stretches   Active Hamstring Stretch 3 reps;30 seconds   Lumbar Exercises: Aerobic   Stationary Bike Nu-Step L7, 10 minutes   Lumbar Exercises: Machines for Strengthening   Leg Press  10 X each 1-4 platesn no pain   Lumbar Exercises: Standing   Wall Slides 10 reps  10 seconds   Other Standing Lumbar Exercises hip hinge 10 X multiple cues, 0 LBS   Knee/Hip Exercises: Stretches   Gastroc Stretch 3 reps;30 seconds   on step   Other Knee/Hip Stretches able to reach within 1/4 inch from toes from standing, letaral flexion able to reach knee jointline RT and left.  equally.  Back extension limited 20 % feels stiff.                  PT Education - 04/11/15 0845    Education provided Yes   Education Details wall sit   Person(s) Educated Patient   Methods Explanation;Verbal cues;Demonstration;Handout   Comprehension Verbalized understanding;Returned demonstration          PT Short Term Goals - 04/04/15 0950    PT SHORT TERM GOAL #1   Title pt will be I with inital HEP (03/22/2015)   Baseline able to recite entire program   Time 3   Period Weeks   Status Achieved           PT Long Term Goals - 04/11/15 [id number]    PT LONG TERM GOAL #1   Time 6   Period Weeks   Status On-going   PT LONG TERM GOAL #2   Title pt will demonstrate decreased low back muscle spasme to assist with improved trunk mobility (04/12/2015)  Plan - 04/11/15 0846    Clinical Impression Statement Patient wants 1 more visit prior to discharge.  FOTO today revealed 37% limitation which is unchanged from initial rating.  Pain increased this am at home when he bent over to get something out of the sink cabinet. (forward flexed posture).  Hamstring length is greatly improved.    He stretches on a consistant basis.  He had less pain post session.    PT Next Visit Plan finalize home program.  Home walking ?   PT Home Exercise Plan wall sit   Consulted and Agree with Plan of Care Patient        Problem List There are no active problems to display for this patient.   [place] 04/11/2015, 12:30 PM  [facility] [address] [place], [place], [address] Phone: [phone]   Fax:  [phone]  Name: Adam Hubbard MRN: [id number] Date of Birth: [birth year]    Melvenia Needles, PTA 04/11/2015 12:30 PM Phone: [phone] Fax:  [phone]

## 2015-04-13 ENCOUNTER — Encounter: Payer: 59 | Admitting: Physical Therapy

## 2015-04-17 ENCOUNTER — Encounter: Payer: 59 | Admitting: Physical Therapy

## 2015-04-19 ENCOUNTER — Ambulatory Visit: Payer: 59 | Admitting: Physical Therapy

## 2015-04-19 DIAGNOSIS — R293 Abnormal posture: Secondary | ICD-10-CM

## 2015-04-19 DIAGNOSIS — M545 Low back pain, unspecified: Secondary | ICD-10-CM

## 2015-04-19 DIAGNOSIS — M5386 Other specified dorsopathies, lumbar region: Secondary | ICD-10-CM

## 2015-04-19 DIAGNOSIS — M62830 Muscle spasm of back: Secondary | ICD-10-CM

## 2015-04-19 NOTE — Therapy (Signed)
[place], [place], [address] Phone: [phone]   Fax:  [phone]  Physical Therapy Treatment / discharge note  Patient Details  Name: Adam Hubbard MRN: [id number] Date of Birth: [birth year] Referring Provider: Dr. Jean Rosenthal  Encounter Date: 04/19/2015      PT End of Session - 04/19/15 0917    Visit Number 11   Number of Visits 12   Date for PT Re-Evaluation 04/12/15   PT Start Time 0845   PT Stop Time 0925   PT Time Calculation (min) 40 min   Activity Tolerance Patient tolerated treatment well;No increased pain   Behavior During Therapy [facility] for tasks assessed/performed      No past medical history on file.  Past Surgical History  Procedure Laterality Date  . Knee surgery      There were no vitals filed for this visit.  Visit Diagnosis:  Left-sided low back pain without sciatica  Back muscle spasm  Decreased ROM of lumbar spine  Abnormal posture      Subjective Assessment - 04/19/15 0850    Subjective I'm more flexible.  Shoes are less a struggle after starting the hip exercises. Stretches every morning religiously.    Pain has not really changed with PT/     Pain Score 3    Pain Orientation Lower   Pain Descriptors / Indicators Aching;Dull   Pain Type Chronic pain   Pain Frequency Constant  varies   Aggravating Factors  moving something really heavy.   Pain Relieving Factors stretches, improved body mechanics   Multiple Pain Sites No            [facility] PT Assessment - 04/19/15 0001    AROM   Lumbar Flexion able to touch toes   Lumbar Extension 36   Lumbar - Right Side Bend 38   Lumbar - Left Side Bend 35                     [facility] Adult PT Treatment/Exercise - 04/19/15 0855    Self-Care   Self-Care --  sled push /pull/  lifting from floor to waist 28 LBS.  able    Lumbar Exercises: Stretches   Passive Hamstring Stretch 3 reps;30 seconds   Quad Stretch Limitations Anterior hip stretch 3 X 30 seconds each.  added to home   Lumbar Exercises: Aerobic   Stationary Bike Nustep L7 10 minutes.                PT Education - 04/19/15 [phone]    Education provided Yes   Education Details walking program, anterior hip stretch   Person(s) Educated Patient   Methods Explanation;Handout   Comprehension Verbalized understanding          PT Short Term Goals - 04/04/15 0950    PT SHORT TERM GOAL #1   Title pt will be I with inital HEP (03/22/2015)   Baseline able to recite entire program   Time 3   Period Weeks   Status Achieved           PT Long Term Goals - 04/19/15 0998    PT LONG TERM GOAL #1   Title pt will be I with all HEP as of last visit (04/12/2015)   Time 6   Period Weeks   Status Achieved   PT LONG TERM GOAL #2   Title pt will demonstrate decreased low back muscle spasme to assist with improved trunk  mobility (04/12/2015)   Baseline unchanged   Time 6   Period Weeks   Status Not Met   PT LONG TERM GOAL #3   Title pt will improve trunk mobility to WNL in all planes with <2/10 pain during and following movement to help with job related activities (04/12/2015)   Time 6   Period Weeks   Status Partially Met   PT LONG TERM GOAL #4   Title pt will be able to verbalize and demonstrate techniques to reduce low back pain and reinjury via postural awareness, lifting and carrying mechanics and HEP (04/12/2015)   Baseline able   Time 6   Period Weeks   Status Achieved   PT LONG TERM GOAL #5   Title pt will increase his FOTO score to > 70 to demosntrate improved function at discharge (04/12/2015)   Baseline FOTO unchanged   Time 6   Period Weeks   Status Not Met               Plan - 04/19/15 1049    Clinical Impression Statement Patient is ready for discharge meeting most of his goals.  He plans to continue his home exercises and begin a walking program for exercise and stress relief.     PT  Next Visit Plan D/C   PT Home Exercise Plan stretch, walking   Consulted and Agree with Plan of Care Patient        Problem List There are no active problems to display for this patient.   [facility] 04/19/2015, 10:54 AM  [facility] [address] [place], [place], [address] Phone: [phone]   Fax:  [phone]  Name: Adam Hubbard MRN: [id number] Date of Birth: 08/21/1960    Melvenia Needles, PTA 04/19/2015 10:54 AM Phone: [phone] Fax: [phone]    PHYSICAL THERAPY DISCHARGE SUMMARY  Visits from Start of Care: 11  Current functional level related to goals / functional outcomes: FOTO  37% limited   Remaining deficits: Muscle spasm of the low back, limited trunk mobility secondary to tightness and intermittent pain.    Education / Equipment: HEP, posture education, theraband for strengthening.   Plan: Patient agrees to discharge.  Patient goals were partially met. Patient is being discharged due to lack of progress.  ?????        Kristoffer Leamon PT, DPT, LAT, ATC  04/19/2015  11:33 AM

## 2015-04-19 NOTE — Patient Instructions (Signed)
WALKING  Walking is a great form of exercise to increase your strength, endurance and overall fitness.  A walking program can help you start slowly and gradually build endurance as you go.  Everyone's ability is different, so each person's starting point will be different.  You do not have to follow them exactly.  The are just samples. You should simply find out what's right for you and stick to that program.   In the beginning, you'll start off walking 2-3 times a day for short distances.  As you get stronger, you'll be walking further at just 1-2 times per day.  A. You Can Walk For A Certain Length Of Time Each Day    Walk 5 minutes 3 times per day.  Increase 2 minutes every 2 days (3 times per day).  Work up to 25-30 minutes (1-2 times per day).   Example:   Day 1-2 5 minutes 3 times per day   Day 7-8 12 minutes 2-3 times per day   Day 13-14 25 minutes 1-2 times per day  B. You Can Walk For a Certain Distance Each Day     Distance can be substituted for time.    Example:   3 trips to mailbox (at road)   3 trips to corner of block   3 trips around the block  C. Go to local [employer] and use the track.    Walk for any distance  around track  Or time 40 minutes  D. Walk 20 minutes  To more minutes 3-4 X a week  Please only do the exercises that your therapist has initialed and dated

## 2015-04-29 HISTORY — PX: RADIOFREQUENCY ABLATION: SHX2290

## 2015-06-05 ENCOUNTER — Other Ambulatory Visit: Payer: Self-pay | Admitting: Orthopaedic Surgery

## 2015-06-05 DIAGNOSIS — M545 Low back pain: Secondary | ICD-10-CM

## 2015-06-12 ENCOUNTER — Ambulatory Visit
Admission: RE | Admit: 2015-06-12 | Discharge: 2015-06-12 | Disposition: A | Discharge status: Home / Self Care | Payer: Commercial Managed Care - PPO | Source: Ambulatory Visit | Attending: Orthopaedic Surgery | Admitting: Orthopaedic Surgery

## 2015-06-12 DIAGNOSIS — M545 Low back pain: Secondary | ICD-10-CM

## 2015-10-22 ENCOUNTER — Other Ambulatory Visit: Payer: Self-pay | Admitting: Neurosurgery

## 2015-10-22 DIAGNOSIS — M5137 Other intervertebral disc degeneration, lumbosacral region: Secondary | ICD-10-CM

## 2015-11-06 ENCOUNTER — Ambulatory Visit
Admission: RE | Admit: 2015-11-06 | Discharge: 2015-11-06 | Disposition: A | Discharge status: Home / Self Care | Payer: Commercial Managed Care - PPO | Source: Ambulatory Visit | Attending: Neurosurgery | Admitting: Neurosurgery

## 2015-11-06 DIAGNOSIS — M5137 Other intervertebral disc degeneration, lumbosacral region: Secondary | ICD-10-CM

## 2015-11-06 MED ORDER — IOPAMIDOL (ISOVUE-M 200) INJECTION 41%: 15.0000 mL | Freq: Once | INTRAMUSCULAR | Status: DC

## 2015-11-06 MED ORDER — DIAZEPAM 5 MG PO TABS
10.0000 mg | ORAL_TABLET | Freq: Once | ORAL | Status: AC
Start: 2015-11-06 — End: 2015-11-06
  Administered 2015-11-06: 10 mg via ORAL

## 2015-11-06 NOTE — Discharge Instructions (Signed)

## 2016-04-30 DIAGNOSIS — M47816 Spondylosis without myelopathy or radiculopathy, lumbar region: Secondary | ICD-10-CM | POA: Diagnosis not present

## 2016-05-02 ENCOUNTER — Emergency Department (HOSPITAL_COMMUNITY)
Admission: EM | Admit: 2016-05-02 | Discharge: 2016-05-02 | Disposition: A | Discharge status: Home / Self Care | Payer: Commercial Managed Care - PPO | Attending: Emergency Medicine | Admitting: Emergency Medicine

## 2016-05-02 ENCOUNTER — Encounter (HOSPITAL_COMMUNITY): Payer: Self-pay

## 2016-05-02 ENCOUNTER — Emergency Department (HOSPITAL_COMMUNITY): Payer: Commercial Managed Care - PPO

## 2016-05-02 DIAGNOSIS — Z5181 Encounter for therapeutic drug level monitoring: Secondary | ICD-10-CM | POA: Diagnosis not present

## 2016-05-02 DIAGNOSIS — R202 Paresthesia of skin: Secondary | ICD-10-CM | POA: Diagnosis not present

## 2016-05-02 DIAGNOSIS — R930 Abnormal findings on diagnostic imaging of skull and head, not elsewhere classified: Secondary | ICD-10-CM | POA: Insufficient documentation

## 2016-05-02 DIAGNOSIS — R200 Anesthesia of skin: Secondary | ICD-10-CM

## 2016-05-02 DIAGNOSIS — Z79899 Other long term (current) drug therapy: Secondary | ICD-10-CM | POA: Diagnosis not present

## 2016-05-02 DIAGNOSIS — M47816 Spondylosis without myelopathy or radiculopathy, lumbar region: Secondary | ICD-10-CM | POA: Diagnosis not present

## 2016-05-02 LAB — DIFFERENTIAL
BASOS ABS: 0 10*3/uL (ref 0.0–0.1)
Basophils Relative: 0 %
EOS ABS: 0.2 10*3/uL (ref 0.0–0.7)
Eosinophils Relative: 2 %
Lymphocytes Relative: 27 %
Lymphs Abs: 2.2 10*3/uL (ref 0.7–4.0)
MONOS PCT: 7 %
Monocytes Absolute: 0.6 10*3/uL (ref 0.1–1.0)
Neutro Abs: 5.1 10*3/uL (ref 1.7–7.7)
Neutrophils Relative %: 64 %

## 2016-05-02 LAB — I-STAT CHEM 8, ED
BUN: 22 mg/dL — ABNORMAL HIGH (ref 6–20)
CHLORIDE: 104 mmol/L (ref 101–111)
CREATININE: 1.1 mg/dL (ref 0.61–1.24)
Calcium, Ion: 1.24 mmol/L (ref 1.15–1.40)
Glucose, Bld: 100 mg/dL — ABNORMAL HIGH (ref 65–99)
HEMATOCRIT: 49 % (ref 39.0–52.0)
HEMOGLOBIN: 16.7 g/dL (ref 13.0–17.0)
POTASSIUM: 4.8 mmol/L (ref 3.5–5.1)
Sodium: 141 mmol/L (ref 135–145)
TCO2: 27 mmol/L (ref 0–100)

## 2016-05-02 LAB — CBC
HEMATOCRIT: 47.8 % (ref 39.0–52.0)
Hemoglobin: 17 g/dL (ref 13.0–17.0)
MCH: 31.5 pg (ref 26.0–34.0)
MCHC: 35.6 g/dL (ref 30.0–36.0)
MCV: 88.7 fL (ref 78.0–100.0)
PLATELETS: 216 10*3/uL (ref 150–400)
RBC: 5.39 MIL/uL (ref 4.22–5.81)
RDW: 12.6 % (ref 11.5–15.5)
WBC: 8.1 10*3/uL (ref 4.0–10.5)

## 2016-05-02 LAB — COMPREHENSIVE METABOLIC PANEL
ALT: 26 U/L (ref 17–63)
AST: 21 U/L (ref 15–41)
Albumin: 4.1 g/dL (ref 3.5–5.0)
Alkaline Phosphatase: 64 U/L (ref 38–126)
Anion gap: 9 (ref 5–15)
BUN: 17 mg/dL (ref 6–20)
CO2: 24 mmol/L (ref 22–32)
Calcium: 9.6 mg/dL (ref 8.9–10.3)
Chloride: 106 mmol/L (ref 101–111)
Creatinine, Ser: 1.06 mg/dL (ref 0.61–1.24)
GFR calc Af Amer: >60 mL/min (ref >60)
Glucose, Bld: 102 mg/dL — ABNORMAL HIGH (ref 65–99)
POTASSIUM: 4.8 mmol/L (ref 3.5–5.1)
SODIUM: 139 mmol/L (ref 135–145)
Total Bilirubin: 0.3 mg/dL (ref 0.3–1.2)
Total Protein: 6.8 g/dL (ref 6.5–8.1)

## 2016-05-02 LAB — I-STAT TROPONIN, ED: TROPONIN I, POC: 0 ng/mL (ref 0.00–0.08)

## 2016-05-02 LAB — PROTIME-INR
INR: 0.93
PROTHROMBIN TIME: 12.5 s (ref 11.4–15.2)

## 2016-05-02 LAB — APTT: APTT: 34 s (ref 24–36)

## 2016-05-02 NOTE — ED Provider Notes (Signed)
[facility] DEPT Provider Note   CSN: [id number] Arrival date & time: 05/02/16  1331     History   Chief Complaint Chief Complaint  Patient presents with  . Numbness    HPI Adam Hubbard is a 56 y.o. male.  HPI   Pt is a 56 yo male with no PMH who presents to the ED with complaint of intermittent numbness, onset 6am. Patient reports while he was driving to work he began having intermittent episodes of numbness to the left side of his face, left upper arm and left lower leg. Patient denies any aggravating or alleviating factors. Denies history of similar episodes in the past. Denies taking any medications for his symptoms. Patient reports having ablation surgery performed on his lower back by his neurosurgeon 3 days ago without any, locations. Patient states he was initially evaluated by his neurosurgeon this afternoon about his new numbness and states he was advised to come to the ED for further evaluation. Patient states his neurosurgeon did not think that his numbness was attributed to his recent procedure performed. Denies either, headache, lightheadedness, dizziness, visual changes, neck pain/stiffness, chest pain, shortness of breath, palpitations, abdominal pain, vomiting, leg swelling, weakness. Denies personal or family hx of CVA. Denies smoking. Patient denies taking any medications on a regular basis at home.  History reviewed. No pertinent past medical history.  There are no active problems to display for this patient.   Past Surgical History:  Procedure Laterality Date  . knee surgery         Home Medications    Prior to Admission medications   Medication Sig Start Date End Date Taking? Authorizing Provider  Doxylamine-DM (VICKS DAYQUIL/NYQUIL COUGH PO) Take 1 capsule by mouth every 6 (six) hours as needed (for symptoms).   Yes Historical Provider, MD  ibuprofen (ADVIL,MOTRIN) 200 MG tablet Take 400 mg by mouth every 6 (six) hours as needed (for pain).   Yes  Historical Provider, MD  naproxen sodium (ALEVE) 220 MG tablet Take 220-440 mg by mouth 2 (two) times daily as needed (for back pain).   Yes Historical Provider, MD  cyclobenzaprine (FLEXERIL) 10 MG tablet Take 1 tablet (10 mg total) by mouth 2 (two) times daily as needed for muscle spasms. Patient not taking: Reported on 05/02/2016 02/11/15   Stevi Barrett, PA-C  naproxen (NAPROSYN) 500 MG tablet Take 1 tablet (500 mg total) by mouth 2 (two) times daily. Patient not taking: Reported on 05/02/2016 02/11/15   Josephina Gip, PA-C    Family History No family history on file.  Social History Social History  Substance Use Topics  . Smoking status: Never Smoker  . Smokeless tobacco: Never Used  . Alcohol use No     Allergies   Penicillins   Review of Systems Review of Systems  Neurological: Positive for numbness.  All other systems reviewed and are negative.    Physical Exam Updated Vital Signs BP 143/96   Pulse 71   Temp 98 F (36.7 C) (Oral)   Resp 22   SpO2 99%   Physical Exam  Constitutional: He is oriented to person, place, and time. He appears well-developed and well-nourished. No distress.  HENT:  Head: Normocephalic and atraumatic.  Mouth/Throat: Oropharynx is clear and moist. No oropharyngeal exudate.  Eyes: Conjunctivae and EOM are normal. Pupils are equal, round, and reactive to light. Right eye exhibits no discharge. Left eye exhibits no discharge. No scleral icterus.  Neck: Normal range of motion. Neck supple.  Cardiovascular:  Normal rate, regular rhythm, normal heart sounds and intact distal pulses.   Pulmonary/Chest: Effort normal and breath sounds normal. No respiratory distress. He has no wheezes. He has no rales. He exhibits no tenderness.  Abdominal: Soft. Bowel sounds are normal. He exhibits no distension and no mass. There is no tenderness. There is no rebound and no guarding. No hernia.  Musculoskeletal: Normal range of motion. He exhibits no edema or  tenderness.  No midline C, T, or L tenderness. Full range of motion of neck and back. Full range of motion of bilateral upper and lower extremities, with 5/5 strength. Sensation intact. 2+ radial and PT pulses. Cap refill <2 seconds. Patient able to stand and ambulate without assistance.  Negative straight leg raise bilaterally.  Lymphadenopathy:    He has no cervical adenopathy.  Neurological: He is alert and oriented to person, place, and time. He has normal strength and normal reflexes. No cranial nerve deficit or sensory deficit. Coordination and gait normal.  Skin: Skin is warm and dry. He is not diaphoretic.  Nursing note and vitals reviewed.    ED Treatments / Results  Labs (all labs ordered are listed, but only abnormal results are displayed) Labs Reviewed  COMPREHENSIVE METABOLIC PANEL - Abnormal; Notable for the following:       Result Value   Glucose, Bld 102 (*)    All other components within normal limits  I-STAT CHEM 8, ED - Abnormal; Notable for the following:    BUN 22 (*)    Glucose, Bld 100 (*)    All other components within normal limits  PROTIME-INR  APTT  CBC  DIFFERENTIAL  I-STAT TROPOININ, ED  CBG MONITORING, ED    EKG  EKG Interpretation None       Radiology Ct Head Wo Contrast  Result Date: 05/02/2016 CLINICAL DATA:  56 year old male with a history of numbness low back EXAM: CT HEAD WITHOUT CONTRAST TECHNIQUE: Contiguous axial images were obtained from the base of the skull through the vertex without intravenous contrast. COMPARISON:  09/03/2005 FINDINGS: Brain: No acute intracranial hemorrhage. No midline shift or mass effect. Unremarkable configuration the ventricles. Gray-white differentiation preserved. Vascular: Minimal calcifications of the cerebral vasculature. There is focal hypodensity at the apex of the basilar artery Skull: Normal. Negative for fracture or focal lesion. Sinuses/Orbits: No acute finding. Other: None. IMPRESSION: No definite CT  evidence of acute intracranial abnormality. There is focal hyperdensity at the apex of the basilar artery. This finding could represent thromboembolic disease, however, the positive predictive value of hyperdense vessel sign is low. If there were concern for acute posterior circulation stroke symptoms, the next best test would be CT angiogram. These results were called by telephone at the time of interpretation on 05/02/2016 at 2:36 pm to Dr. Isla Pence , who verbally acknowledged these results. Signed, Dulcy Fanny. Earleen Newport, DO [facility] Radiology Electronically Signed   By: Corrie Mckusick D.O.   On: 05/02/2016 14:36   Mr Virgel Paling [id number] Contrast  Result Date: 05/02/2016 CLINICAL DATA:  56 y/o M; focally numbness on the left side of body. EXAM: MRI HEAD WITHOUT CONTRAST MRA HEAD WITHOUT CONTRAST TECHNIQUE: Multiplanar, multiecho pulse sequences of the brain and surrounding structures were obtained without intravenous contrast. Angiographic images of the head were obtained using MRA technique without contrast. COMPARISON:  05/02/2016 CT head. FINDINGS: MRI HEAD FINDINGS Brain: No acute infarction, hemorrhage, hydrocephalus, extra-axial collection or mass lesion. Vascular: As below. Skull and upper cervical spine:  Subcentimeter T1/T2 hyperintense focus within the occipital bone left anterior to foramen magnum probably representing focal fat or a small hemangioma. No suspicious abnormal marrow signal. Sinuses/Orbits: Negative. Other: None. MRA HEAD FINDINGS Internal carotid arteries:  Patent. Anterior cerebral arteries:  Patent. Middle cerebral arteries: Patent. Anterior communicating artery: Patent. Posterior communicating arteries: Not identified, likely hypoplastic or absent. Posterior cerebral arteries:  Patent. Basilar artery:  Patent. Vertebral arteries:  Patent. No evidence of high-grade stenosis, large vessel occlusion, or aneurysm. IMPRESSION: 1. No acute  intracranial abnormality.  Unremarkable MRI the brain. 2. No evidence of high-grade stenosis, large vessel occlusion, or aneurysm of the circle of Willis. Electronically Signed   By: Kristine Garbe M.D.   On: 05/02/2016 22:09   Mr Brain Wo Contrast  Result Date: 05/02/2016 CLINICAL DATA:  56 y/o M; focally numbness on the left side of body. EXAM: MRI HEAD WITHOUT CONTRAST MRA HEAD WITHOUT CONTRAST TECHNIQUE: Multiplanar, multiecho pulse sequences of the brain and surrounding structures were obtained without intravenous contrast. Angiographic images of the head were obtained using MRA technique without contrast. COMPARISON:  05/02/2016 CT head. FINDINGS: MRI HEAD FINDINGS Brain: No acute infarction, hemorrhage, hydrocephalus, extra-axial collection or mass lesion. Vascular: As below. Skull and upper cervical spine: Subcentimeter T1/T2 hyperintense focus within the occipital bone left anterior to foramen magnum probably representing focal fat or a small hemangioma. No suspicious abnormal marrow signal. Sinuses/Orbits: Negative. Other: None. MRA HEAD FINDINGS Internal carotid arteries:  Patent. Anterior cerebral arteries:  Patent. Middle cerebral arteries: Patent. Anterior communicating artery: Patent. Posterior communicating arteries: Not identified, likely hypoplastic or absent. Posterior cerebral arteries:  Patent. Basilar artery:  Patent. Vertebral arteries:  Patent. No evidence of high-grade stenosis, large vessel occlusion, or aneurysm. IMPRESSION: 1. No acute intracranial abnormality.  Unremarkable MRI the brain. 2. No evidence of high-grade stenosis, large vessel occlusion, or aneurysm of the circle of Willis. Electronically Signed   By: Kristine Garbe M.D.   On: 05/02/2016 22:09    Procedures Procedures (including critical care time)  Medications Ordered in ED Medications - No data to display   Initial Impression / Assessment and Plan / ED Course  I have reviewed the triage  vital signs and the nursing notes.  Pertinent labs & imaging results that were available during my care of the patient were reviewed by me and considered in my medical decision making (see chart for details).  Clinical Course     Patient presents with intermittent numbness to the left side of his body that started this morning on his drive to work. Patient reports having radioablation surgery performed to his lumbar spine 3 days ago by his neurosurgeon due to his chronic back pain. Denies fever or back pain. VSS. Exam unremarkable. No neuro deficits. CT head showed focal hyperdensity at apex of basilar artery, possibly due to thromboembolic disease. Labs unremarkable. Discussed case with Dr. Venora Maples. Will order MRI for further evaluation. MR brain and angio unremarkable, no evidence of high-grade stenosis, large vessel occlusion or aneurysm of circle of Willis. On reevaluation patient is resting in bed comfortably and denies having any numbness. Discussed results with patient. Plan discharge patient home with outpatient neurology follow-up for further evaluation of his intermittent numbness. Patient reports understanding and agreement. Discussed return precautions.  Final Clinical Impressions(s) / ED Diagnoses   Final diagnoses:  Numbness    New Prescriptions New Prescriptions   No medications on file     Nona Dell, [place] 05/02/16 2318    Lennette Bihari  Venora Maples, MD 05/03/16 [phone]

## 2016-05-02 NOTE — ED Notes (Signed)
Pt returned to room and placed back on monitor. Pt continues to deny any pain or numbness/weakness at this time.

## 2016-05-02 NOTE — ED Notes (Signed)
Patient verbalized understanding of discharge instructions and denies any further needs or questions at this time. VS stable. Patient ambulatory with steady gait.  

## 2016-05-02 NOTE — Discharge Instructions (Signed)
I recommended calling the neurology clinic listed below to schedule a follow-up appointment next week if your symptoms continue. I also recommend following up with your primary care provider next week for reevaluation regarding your elevated blood pressure seen in the emergency department today. Please return to the Emergency Department if symptoms worsen or new onset of fever, lightheadedness, headache, dizziness, visual changes, chest pain, difficulty breathing, abdominal pain, vomiting, weakness, seizure, syncope.

## 2016-05-02 NOTE — ED Notes (Addendum)
Pt had back surgery done this week and thinks the numbness is related to his procedure.  Pt called Dr who is a neurologist about symptoms.  Pt c/o numbness on his left arm and face earlier but no numbness currently.  MD advised the pt to go the ED as he did not think this was related to the procedure.

## 2016-05-02 NOTE — ED Notes (Signed)
Pt reports he woke up this morning around 5:15 with numbness to the left side of his body. He states the numbness has gotten better and he is not as numb, but his doctor told him to come to ER to be evaluated for a stroke. He did have recent back surgery. Pt A&Ox4, ambulatory with steady gait, speech clear, no facial droop noted at this time.

## 2016-05-02 NOTE — ED Notes (Signed)
Patient ambulated to restroom with steady gait. Denies dizziness, numbness, or weakness in extremities, no complaints at this time.

## 2016-05-06 ENCOUNTER — Telehealth: Payer: Self-pay | Admitting: Neurology

## 2016-05-06 ENCOUNTER — Ambulatory Visit (INDEPENDENT_AMBULATORY_CARE_PROVIDER_SITE_OTHER): Payer: Commercial Managed Care - PPO | Admitting: Neurology

## 2016-05-06 ENCOUNTER — Encounter: Payer: Self-pay | Admitting: Neurology

## 2016-05-06 VITALS — BP 156/101 | HR 89 | Ht 70.0 in | Wt 239.0 lb

## 2016-05-06 DIAGNOSIS — R202 Paresthesia of skin: Secondary | ICD-10-CM | POA: Diagnosis not present

## 2016-05-06 DIAGNOSIS — G451 Carotid artery syndrome (hemispheric): Secondary | ICD-10-CM
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
## 2016-05-06 NOTE — Patient Instructions (Signed)
   Go on aspirin 81 mg daily. We will get a carotid doppler to look at the blood circulation to the brain. Go to the primary MD for blood pressure management.

## 2016-05-06 NOTE — Telephone Encounter (Signed)
Called and left message to call me back to schedule his Doppler.

## 2016-05-08 DIAGNOSIS — M519 Unspecified thoracic, thoracolumbar and lumbosacral intervertebral disc disorder: Secondary | ICD-10-CM | POA: Diagnosis not present

## 2016-05-08 DIAGNOSIS — M549 Dorsalgia, unspecified: Secondary | ICD-10-CM | POA: Diagnosis not present

## 2016-05-08 DIAGNOSIS — I10 Essential (primary) hypertension: Secondary | ICD-10-CM | POA: Diagnosis not present

## 2016-05-08 DIAGNOSIS — M5137 Other intervertebral disc degeneration, lumbosacral region: Secondary | ICD-10-CM | POA: Diagnosis not present

## 2016-05-14 ENCOUNTER — Other Ambulatory Visit: Payer: Commercial Managed Care - PPO

## 2016-05-15 ENCOUNTER — Other Ambulatory Visit: Payer: Commercial Managed Care - PPO

## 2016-05-21 ENCOUNTER — Other Ambulatory Visit: Payer: Commercial Managed Care - PPO

## 2016-05-28 ENCOUNTER — Ambulatory Visit (INDEPENDENT_AMBULATORY_CARE_PROVIDER_SITE_OTHER): Payer: Commercial Managed Care - PPO

## 2016-05-28 DIAGNOSIS — R202 Paresthesia of skin: Secondary | ICD-10-CM | POA: Diagnosis not present

## 2016-05-28 DIAGNOSIS — G451 Carotid artery syndrome (hemispheric): Secondary | ICD-10-CM | POA: Diagnosis not present

## 2016-05-30 ENCOUNTER — Telehealth: Payer: Self-pay | Admitting: Neurology

## 2016-05-30 NOTE — Telephone Encounter (Signed)
I called the patient. A carotid Doppler study is unremarkable, the patient has not had any further events of left-sided numbness.

## 2016-06-09 DIAGNOSIS — M545 Low back pain: Secondary | ICD-10-CM | POA: Diagnosis not present

## 2016-06-09 DIAGNOSIS — M6281 Muscle weakness (generalized): Secondary | ICD-10-CM | POA: Diagnosis not present

## 2016-06-09 DIAGNOSIS — M5137 Other intervertebral disc degeneration, lumbosacral region: Secondary | ICD-10-CM | POA: Diagnosis not present

## 2016-06-11 ENCOUNTER — Other Ambulatory Visit: Payer: Self-pay | Admitting: Internal Medicine

## 2016-06-11 DIAGNOSIS — M519 Unspecified thoracic, thoracolumbar and lumbosacral intervertebral disc disorder: Secondary | ICD-10-CM | POA: Diagnosis not present

## 2016-06-11 DIAGNOSIS — N509 Disorder of male genital organs, unspecified: Secondary | ICD-10-CM

## 2016-06-11 DIAGNOSIS — Z0000 Encounter for general adult medical examination without abnormal findings: Secondary | ICD-10-CM | POA: Diagnosis not present

## 2016-06-11 DIAGNOSIS — Z1389 Encounter for screening for other disorder: Secondary | ICD-10-CM | POA: Diagnosis not present

## 2016-06-11 DIAGNOSIS — Z1159 Encounter for screening for other viral diseases: Secondary | ICD-10-CM | POA: Diagnosis not present

## 2016-06-11 DIAGNOSIS — I10 Essential (primary) hypertension: Secondary | ICD-10-CM | POA: Diagnosis not present

## 2016-06-11 DIAGNOSIS — G4733 Obstructive sleep apnea (adult) (pediatric): Secondary | ICD-10-CM | POA: Diagnosis not present

## 2016-06-12 DIAGNOSIS — M5137 Other intervertebral disc degeneration, lumbosacral region: Secondary | ICD-10-CM | POA: Diagnosis not present

## 2016-06-12 DIAGNOSIS — M6281 Muscle weakness (generalized): Secondary | ICD-10-CM | POA: Diagnosis not present

## 2016-06-12 DIAGNOSIS — M545 Low back pain: Secondary | ICD-10-CM | POA: Diagnosis not present

## 2016-06-18 DIAGNOSIS — M6281 Muscle weakness (generalized): Secondary | ICD-10-CM | POA: Diagnosis not present

## 2016-06-18 DIAGNOSIS — M545 Low back pain: Secondary | ICD-10-CM | POA: Diagnosis not present

## 2016-06-18 DIAGNOSIS — M5137 Other intervertebral disc degeneration, lumbosacral region: Secondary | ICD-10-CM | POA: Diagnosis not present

## 2016-06-19 ENCOUNTER — Ambulatory Visit
Admission: RE | Admit: 2016-06-19 | Discharge: 2016-06-19 | Disposition: A | Discharge status: Home / Self Care | Payer: Commercial Managed Care - PPO | Source: Ambulatory Visit | Attending: Internal Medicine | Admitting: Internal Medicine

## 2016-06-19 DIAGNOSIS — N509 Disorder of male genital organs, unspecified: Secondary | ICD-10-CM

## 2016-06-26 DIAGNOSIS — L821 Other seborrheic keratosis: Secondary | ICD-10-CM | POA: Diagnosis not present

## 2016-06-26 DIAGNOSIS — L814 Other melanin hyperpigmentation: Secondary | ICD-10-CM | POA: Diagnosis not present

## 2016-06-26 DIAGNOSIS — D225 Melanocytic nevi of trunk: Secondary | ICD-10-CM | POA: Diagnosis not present

## 2016-06-27 DIAGNOSIS — M545 Low back pain: Secondary | ICD-10-CM | POA: Diagnosis not present

## 2016-06-27 DIAGNOSIS — M5137 Other intervertebral disc degeneration, lumbosacral region: Secondary | ICD-10-CM | POA: Diagnosis not present

## 2016-06-27 DIAGNOSIS — M6281 Muscle weakness (generalized): Secondary | ICD-10-CM | POA: Diagnosis not present

## 2016-06-30 DIAGNOSIS — K4020 Bilateral inguinal hernia, without obstruction or gangrene, not specified as recurrent: Secondary | ICD-10-CM | POA: Diagnosis not present

## 2016-07-02 DIAGNOSIS — M6281 Muscle weakness (generalized): Secondary | ICD-10-CM | POA: Diagnosis not present

## 2016-07-02 DIAGNOSIS — M5137 Other intervertebral disc degeneration, lumbosacral region: Secondary | ICD-10-CM | POA: Diagnosis not present

## 2016-07-02 DIAGNOSIS — M545 Low back pain: Secondary | ICD-10-CM | POA: Diagnosis not present

## 2016-07-04 ENCOUNTER — Other Ambulatory Visit: Payer: Self-pay | Admitting: Gastroenterology

## 2016-07-09 DIAGNOSIS — M5137 Other intervertebral disc degeneration, lumbosacral region: Secondary | ICD-10-CM | POA: Diagnosis not present

## 2016-07-09 DIAGNOSIS — M6281 Muscle weakness (generalized): Secondary | ICD-10-CM | POA: Diagnosis not present

## 2016-07-09 DIAGNOSIS — M545 Low back pain: Secondary | ICD-10-CM | POA: Diagnosis not present

## 2016-07-15 DIAGNOSIS — M5137 Other intervertebral disc degeneration, lumbosacral region: Secondary | ICD-10-CM | POA: Diagnosis not present

## 2016-07-17 DIAGNOSIS — G4733 Obstructive sleep apnea (adult) (pediatric): Secondary | ICD-10-CM | POA: Diagnosis not present

## 2016-08-02 DIAGNOSIS — G4730 Sleep apnea, unspecified: Secondary | ICD-10-CM | POA: Diagnosis not present

## 2016-08-05 DIAGNOSIS — G4733 Obstructive sleep apnea (adult) (pediatric): Secondary | ICD-10-CM | POA: Diagnosis not present

## 2016-08-26 DIAGNOSIS — I10 Essential (primary) hypertension: Secondary | ICD-10-CM | POA: Diagnosis not present

## 2016-08-26 DIAGNOSIS — M47816 Spondylosis without myelopathy or radiculopathy, lumbar region: Secondary | ICD-10-CM | POA: Diagnosis not present

## 2016-09-02 DIAGNOSIS — M47816 Spondylosis without myelopathy or radiculopathy, lumbar region: Secondary | ICD-10-CM | POA: Diagnosis not present

## 2016-09-04 ENCOUNTER — Encounter (HOSPITAL_COMMUNITY): Payer: Self-pay

## 2016-09-08 ENCOUNTER — Encounter (HOSPITAL_COMMUNITY)
Admission: RE | Disposition: A | Discharge status: Home / Self Care | Payer: Self-pay | Source: Ambulatory Visit | Attending: Gastroenterology

## 2016-09-08 ENCOUNTER — Encounter (HOSPITAL_COMMUNITY): Payer: Self-pay

## 2016-09-08 ENCOUNTER — Ambulatory Visit (HOSPITAL_COMMUNITY)
Admission: RE | Admit: 2016-09-08 | Discharge: 2016-09-08 | Disposition: A | Discharge status: Home / Self Care | Payer: Commercial Managed Care - PPO | Source: Ambulatory Visit | Attending: Gastroenterology | Admitting: Gastroenterology

## 2016-09-08 ENCOUNTER — Ambulatory Visit (HOSPITAL_COMMUNITY): Payer: Commercial Managed Care - PPO | Admitting: Certified Registered Nurse Anesthetist

## 2016-09-08 DIAGNOSIS — M1710 Unilateral primary osteoarthritis, unspecified knee: Secondary | ICD-10-CM | POA: Diagnosis not present

## 2016-09-08 DIAGNOSIS — Z79899 Other long term (current) drug therapy: Secondary | ICD-10-CM | POA: Insufficient documentation

## 2016-09-08 DIAGNOSIS — Z1211 Encounter for screening for malignant neoplasm of colon: Secondary | ICD-10-CM | POA: Diagnosis not present

## 2016-09-08 DIAGNOSIS — Z7982 Long term (current) use of aspirin: Secondary | ICD-10-CM | POA: Insufficient documentation

## 2016-09-08 DIAGNOSIS — D124 Benign neoplasm of descending colon: Secondary | ICD-10-CM | POA: Diagnosis not present

## 2016-09-08 DIAGNOSIS — Z880 Allergy status to penicillin: Secondary | ICD-10-CM | POA: Insufficient documentation

## 2016-09-08 DIAGNOSIS — I10 Essential (primary) hypertension: Secondary | ICD-10-CM | POA: Diagnosis not present

## 2016-09-08 DIAGNOSIS — G4733 Obstructive sleep apnea (adult) (pediatric): Secondary | ICD-10-CM | POA: Insufficient documentation

## 2016-09-08 HISTORY — PX: COLONOSCOPY WITH PROPOFOL: SHX5780

## 2016-09-08 SURGERY — COLONOSCOPY WITH PROPOFOL
Anesthesia: Monitor Anesthesia Care

## 2016-09-08 MED ORDER — LACTATED RINGERS IV SOLN
INTRAVENOUS | Status: DC
Start: 2016-09-08 — End: 2016-09-08
  Administered 2016-09-08: 1000 mL via INTRAVENOUS

## 2016-09-08 MED ORDER — PROPOFOL 500 MG/50ML IV EMUL
INTRAVENOUS | Status: DC | PRN
  Administered 2016-09-08: 100 ug/kg/min via INTRAVENOUS

## 2016-09-08 MED ORDER — PROPOFOL 10 MG/ML IV BOLUS
INTRAVENOUS | Status: AC
  Filled 2016-09-08: qty 40

## 2016-09-08 MED ORDER — PROPOFOL 10 MG/ML IV BOLUS
INTRAVENOUS | Status: DC | PRN
  Administered 2016-09-08 (×2): 20 mg via INTRAVENOUS
  Administered 2016-09-08: 40 mg via INTRAVENOUS
  Administered 2016-09-08: 50 mg via INTRAVENOUS
  Administered 2016-09-08: 20 mg via INTRAVENOUS

## 2016-09-08 MED ORDER — SODIUM CHLORIDE 0.9 % IV SOLN: INTRAVENOUS | Status: DC

## 2016-09-08 SURGICAL SUPPLY — 21 items

## 2016-09-08 NOTE — H&P (Signed)
Procedure: Baseline screening colonoscopy  History: The patient is a 56 year old male born October 31, 1960. He is scheduled to undergo his first screening colonoscopy with polypectomy to prevent colon cancer. There is no family history of colon cancer.  Medication allergies: Penicillin caused hives  Past medical history: Obstructive sleep apnea. Osteoarthritis of the knee. Bilateral inguinal hernias. Right meniscal knee tear surgery.  Exam: Patient is alert and lying comfortably on the endoscopy stretcher. Abdomen is soft and nontender to palpation. Lungs are clear to auscultation. Cardiac exam reveals a regular rhythm.  Plan: Proceed with screening colonoscopy

## 2016-09-08 NOTE — Transfer of Care (Signed)
Immediate Anesthesia Transfer of Care Note  Patient: RAYHAN GROLEAU  Procedure(s) Performed: Procedure(s): COLONOSCOPY WITH PROPOFOL (N/A)  Patient Location: PACU  Anesthesia Type:MAC  Level of Consciousness: Patient easily awoken, sedated, comfortable, cooperative, following commands, responds to stimulation.   Airway & Oxygen Therapy: Patient spontaneously breathing, ventilating well, oxygen via simple oxygen mask.  Post-op Assessment: Report given to PACU RN, vital signs reviewed and stable, moving all extremities.   Post vital signs: Reviewed and stable.  Complications: No apparent anesthesia complications  Last Vitals:  Vitals:   09/08/16 0737 09/08/16 0842  BP: (!) 147/89 107/65  Pulse: 81 86  Resp: 19 (!) 23  Temp: 36.7 C 36.4 C    Last Pain:  Vitals:   09/08/16 0842  TempSrc: Oral         Complications: No apparent anesthesia complications

## 2016-09-08 NOTE — Anesthesia Preprocedure Evaluation (Signed)
Anesthesia Evaluation  Patient identified by MRN, date of birth, ID band Patient awake    Reviewed: Allergy & Precautions, NPO status , Patient's Chart, lab work & pertinent test results  Airway Mallampati: II  TM Distance: >3 FB Neck ROM: Full    Dental no notable dental hx.    Pulmonary neg pulmonary ROS,    Pulmonary exam normal breath sounds clear to auscultation       Cardiovascular hypertension, negative cardio ROS Normal cardiovascular exam Rhythm:Regular Rate:Normal     Neuro/Psych negative neurological ROS  negative psych ROS   GI/Hepatic negative GI ROS, Neg liver ROS,   Endo/Other  negative endocrine ROS  Renal/GU negative Renal ROS  negative genitourinary   Musculoskeletal negative musculoskeletal ROS (+)   Abdominal   Peds negative pediatric ROS (+)  Hematology negative hematology ROS (+)   Anesthesia Other Findings   Reproductive/Obstetrics negative OB ROS                             Anesthesia Physical Anesthesia Plan  ASA: III  Anesthesia Plan: MAC   Post-op Pain Management:    Induction: Intravenous  Airway Management Planned:   Additional Equipment:   Intra-op Plan:   Post-operative Plan:   Informed Consent: I have reviewed the patients History and Physical, chart, labs and discussed the procedure including the risks, benefits and alternatives for the proposed anesthesia with the patient or authorized representative who has indicated his/her understanding and acceptance.   Dental advisory given  Plan Discussed with: CRNA and Surgeon  Anesthesia Plan Comments:         Anesthesia Quick Evaluation

## 2016-09-08 NOTE — Op Note (Signed)
[facility] Patient Name: Adam Hubbard Procedure Date: 09/08/2016 MRN: [id number] Attending MD: Garlan Fair , MD Date of Birth: 16-Nov-1960 CSN: [id number] Age: 56 Admit Type: Outpatient Procedure:                Colonoscopy Indications:              Screening for colorectal malignant neoplasm Providers:                Garlan Fair, MD, Laverta Baltimore RN, RN,                            Cherylynn Ridges, Technician, Heide Scales, CRNA Referring MD:              Medicines:                Propofol per Anesthesia Complications:            No immediate complications. Estimated Blood Loss:     Estimated blood loss was minimal. Procedure:                Pre-Anesthesia Assessment:                           - Prior to the procedure, a History and Physical                            was performed, and patient medications and                            allergies were reviewed. The patient's tolerance of                            previous anesthesia was also reviewed. The risks                            and benefits of the procedure and the sedation                            options and risks were discussed with the patient.                            All questions were answered, and informed consent                            was obtained. Prior Anticoagulants: The patient has                            taken aspirin, last dose was 1 day prior to                            procedure. ASA Grade Assessment: II - A patient                            with mild systemic disease. After reviewing the  risks and benefits, the patient was deemed in                            satisfactory condition to undergo the procedure.                           After obtaining informed consent, the colonoscope                            was passed under direct vision. Throughout the                            procedure, the patient's blood pressure, pulse, and                         oxygen saturations were monitored continuously. The                            EC-[id number] ([id number]) scope was introduced through                            the anus and advanced to the the cecum, identified                            by appendiceal orifice and ileocecal valve. The                            colonoscopy was performed without difficulty. The                            patient tolerated the procedure well. The quality                            of the bowel preparation was good. The terminal                            ileum, the ileocecal valve, the appendiceal orifice                            and the rectum were photographed. Scope In: 8:19:32 AM Scope Out: 8:33:07 AM Scope Withdrawal Time: 0 hours 8 minutes 35 seconds  Total Procedure Duration: 0 hours 13 minutes 35 seconds  Findings:      The perianal and digital rectal examinations were normal.      The entire examined colon appeared normal except for the removal of a 3       mm sessile diatal descending colon polyp with the cold biopsy fprceps. Impression:               - The entire examined colon is normal except for                            the removal of a 3 mm sesslie distal descending  colon polyp. Moderate Sedation:      N/A- Per Anesthesia Care Recommendation:           - Patient has a contact number available for                            emergencies. The signs and symptoms of potential                            delayed complications were discussed with the                            patient. Return to normal activities tomorrow.                            Written discharge instructions were provided to the                            patient.                           - Repeat colonoscopy date to be determined after                            pending pathology results are reviewed for                            screening purposes.                            - Resume previous diet.                           - Continue present medications. Procedure Code(s):        --- Professional ---                           C1638, Colorectal cancer screening; colonoscopy on                            individual not meeting criteria for high risk Diagnosis Code(s):        --- Professional ---                           Z12.11, Encounter for screening for malignant                            neoplasm of colon CPT copyright 2016 American [employer] Association. All rights reserved. The codes documented in this report are preliminary and upon coder review may  be revised to meet current compliance requirements. Earle Gell, MD Garlan Fair, MD 09/08/2016 8:42:40 AM This report has been signed electronically. Number of Addenda: 0

## 2016-09-08 NOTE — Anesthesia Postprocedure Evaluation (Signed)
Anesthesia Post Note  Patient: Adam Hubbard  Procedure(s) Performed: Procedure(s) (LRB): COLONOSCOPY WITH PROPOFOL (N/A)  Patient location during evaluation: PACU Anesthesia Type: MAC Level of consciousness: awake and alert Pain management: pain level controlled Vital Signs Assessment: post-procedure vital signs reviewed and stable Respiratory status: spontaneous breathing, nonlabored ventilation, respiratory function stable and patient connected to nasal cannula oxygen Cardiovascular status: stable and blood pressure returned to baseline Anesthetic complications: no       Last Vitals:  Vitals:   09/08/16 0850 09/08/16 0900  BP: 122/79 (!) 145/91  Pulse: 90 83  Resp: (!) 23 20  Temp:      Last Pain:  Vitals:   09/08/16 0842  TempSrc: Oral                 [name] [name]

## 2016-09-08 NOTE — Discharge Instructions (Signed)

## 2016-09-09 ENCOUNTER — Encounter (HOSPITAL_COMMUNITY): Payer: Self-pay | Admitting: Gastroenterology

## 2016-11-19 DIAGNOSIS — M47816 Spondylosis without myelopathy or radiculopathy, lumbar region: Secondary | ICD-10-CM | POA: Diagnosis not present

## 2016-12-09 DIAGNOSIS — I10 Essential (primary) hypertension: Secondary | ICD-10-CM | POA: Diagnosis not present

## 2016-12-09 DIAGNOSIS — M47816 Spondylosis without myelopathy or radiculopathy, lumbar region: Secondary | ICD-10-CM | POA: Diagnosis not present

## 2016-12-11 DIAGNOSIS — K4020 Bilateral inguinal hernia, without obstruction or gangrene, not specified as recurrent: Secondary | ICD-10-CM | POA: Diagnosis not present

## 2016-12-11 DIAGNOSIS — I10 Essential (primary) hypertension: Secondary | ICD-10-CM | POA: Diagnosis not present

## 2016-12-21 IMAGING — CR DG MYELOGRAPHY LUMBAR INJ LUMBOSACRAL
7 of 12 series · 7 of 12 positions shown · non-contrast
Comparison: MRI 06/12/2015

CLINICAL DATA: Low back pain radiating to the left buttock. Rare
radiation to the left thigh. Spondylosis without myelopathy.
TECHNIQUE: Contiguous axial images were obtained through the Lumbar spine after
the intrathecal infusion of infusion. Coronal and sagittal
reconstructions were obtained of the axial image sets.

[w lumbar spine lat]
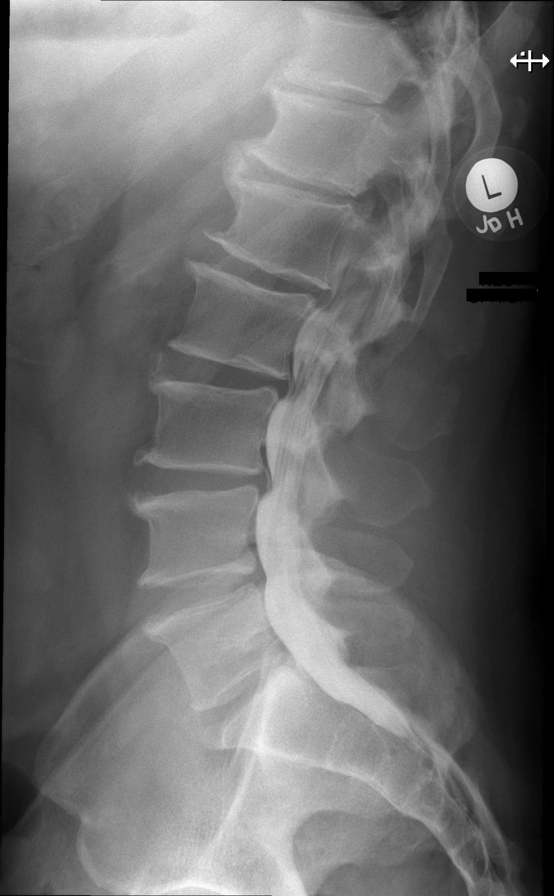

[vasc adipose (1 of 4)]
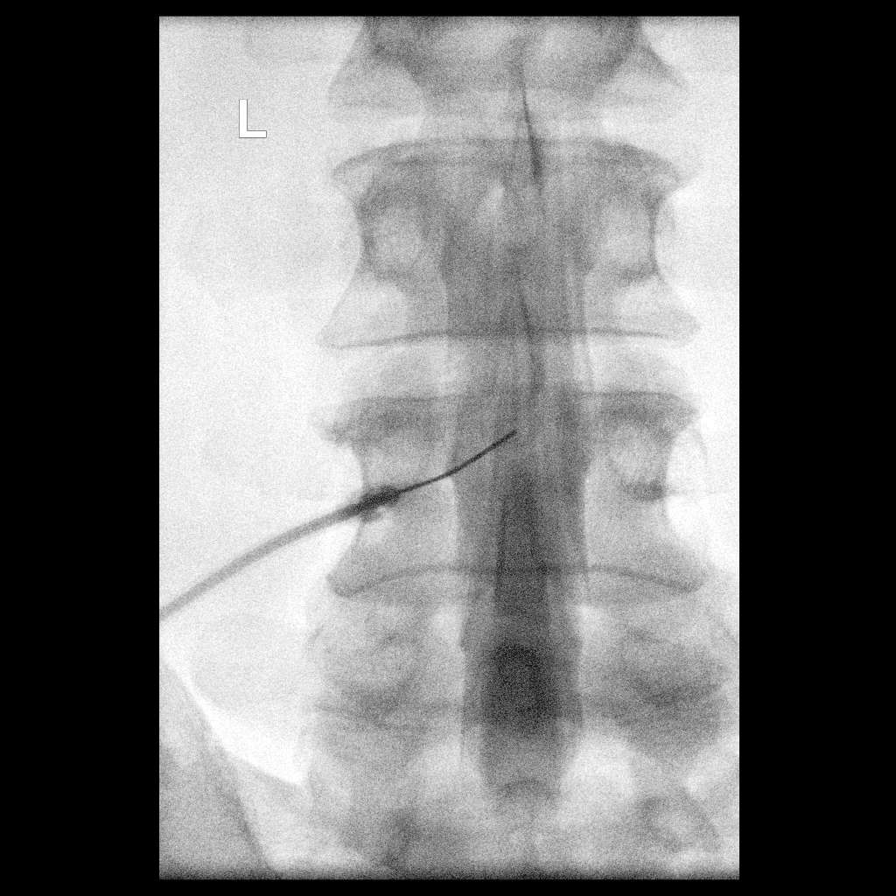

[vasc adipose (2 of 4)]
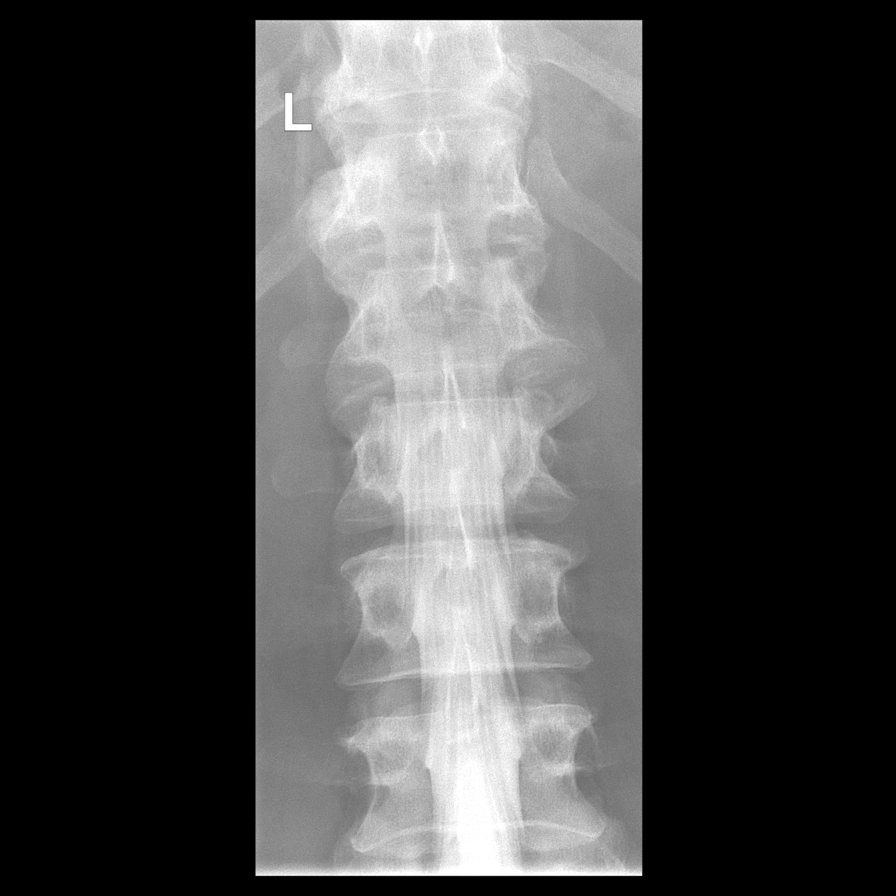

[w lumbar spine flexion]
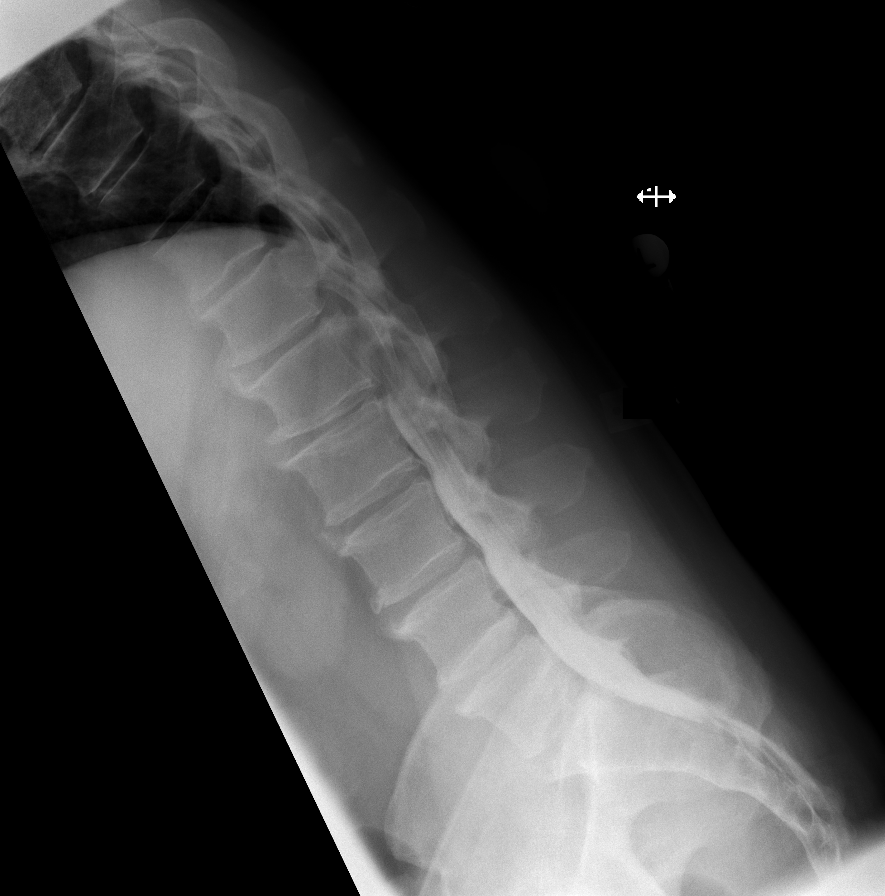

[w lumbar spine extension]
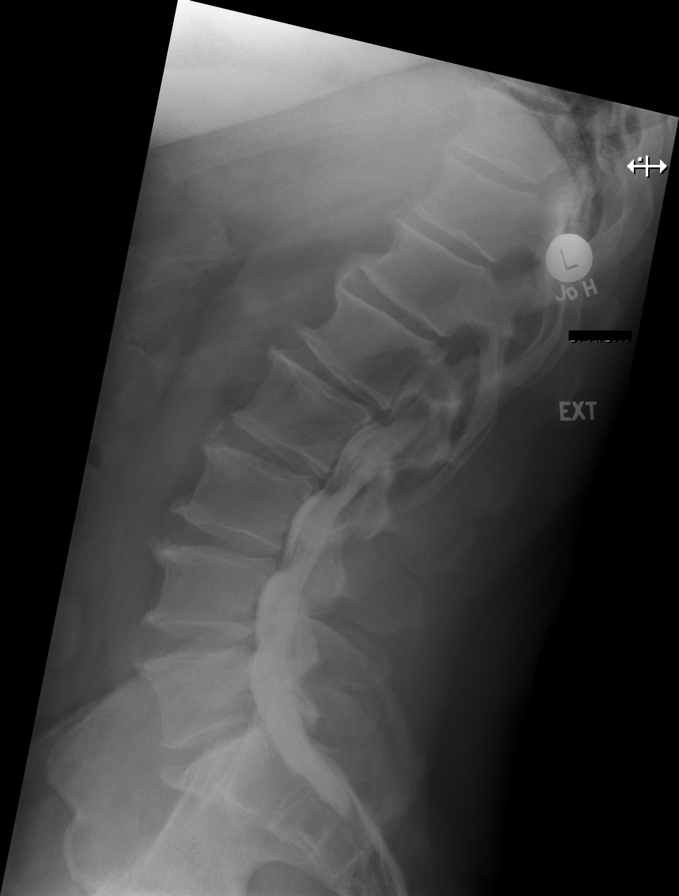

[vasc adipose (3 of 4)]
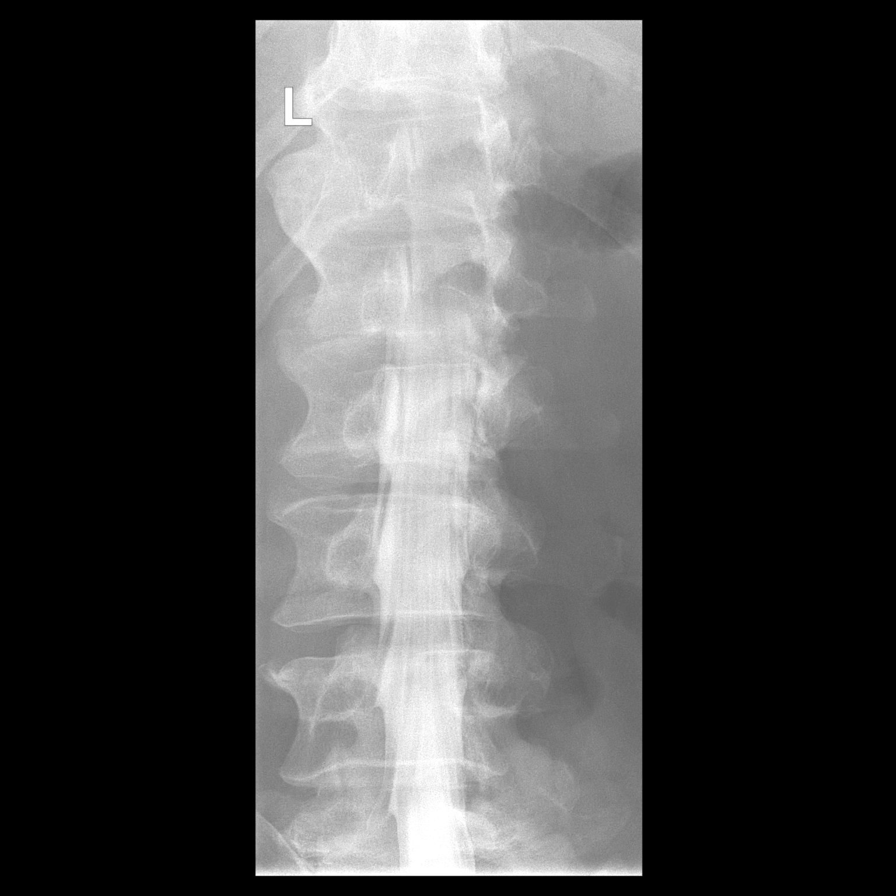

[vasc adipose (4 of 4)]
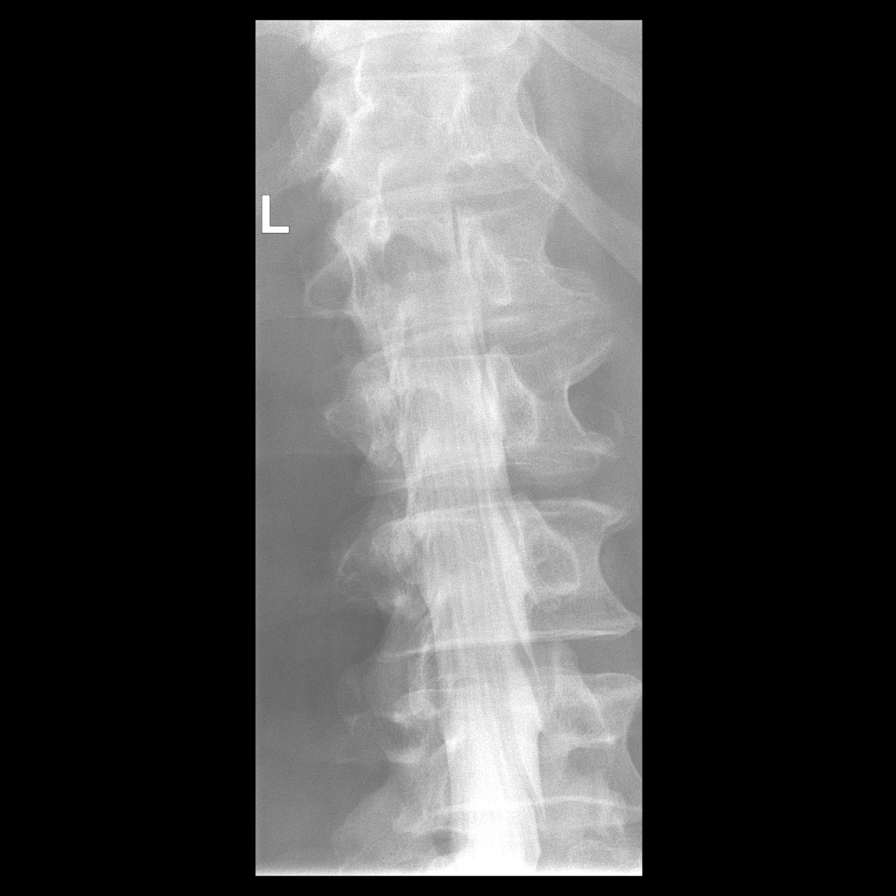

[7 of 12 positions shown; findings below may reference images not displayed]

EXAM:
LUMBAR MYELOGRAM

FLUOROSCOPY TIME:  0 minutes 44 seconds. 222.72 micro gray meter
squared

PROCEDURE:
After thorough discussion of risks and benefits of the procedure
including bleeding, infection, injury to nerves, blood vessels,
adjacent structures as well as headache and CSF leak, written and
oral informed consent was obtained. Consent was obtained by Dr. Seojeong
Kimone. Time out form was completed.

Patient was positioned prone on the fluoroscopy table. Local
anesthesia was provided with 1% lidocaine without epinephrine after
prepped and draped in the usual sterile fashion. Puncture was
performed at L3-4 using a 3 1/2 inch 22-gauge spinal needle via left
para median approach. Using a single pass through the dura, the
needle was placed within the thecal sac, with return of clear CSF.
15 mL of Isovue 200 was injected into the thecal sac, with normal
opacification of the nerve roots and cauda equina consistent with
free flow within the subarachnoid space.

I personally performed the lumbar puncture and administered the
intrathecal contrast. I also personally performed acquisition of the
myelogram images.
FINDINGS: LUMBAR MYELOGRAM FINDINGS:

No compressive central canal stenosis. Anterior extradural defects
at L1-2, L2-3, L3-4 and L4-5. Root sleeves fill normally.

Standing lateral flexion extension views do not show any
subluxation.

CT LUMBAR MYELOGRAM FINDINGS:

T11-12:  No disc pathology.  Mild facet hypertrophy.  No stenosis.

T12-L1: Shallow right paracentral disc herniation indents the
ventral subarachnoid space. Narrowing of the right lateral recess
with some displacement of the nerve roots of the cauda equina. No
gross neural compression.

L1-2: Central disc protrusion indents the ventral subarachnoid space
but does not cause neural compression. Conus tip at upper L2.

L2-3: Circumferential annular bulging with annular calcification in
the midline. Slight indentation of the thecal sac without
compressive stenosis.

L3-4: Circumferential annular bulging with annular calcification in
the midline. Slight indentation of the thecal sac without
compressive stenosis.

L4-5: Mild bulging of the disc. Mild facet hypertrophy. No stenosis.

L5-S1: Minimal bulging of the disc. Minimal facet degeneration. No
stenosis.

Sacroiliac joints appear unremarkable.
IMPRESSION: Myelography shows anterior extradural defects from L1-2 through L4-5
but no compressive central canal stenosis or evidence of focal nerve
compression.

Chronic right paracentral disc herniation at T12-L1 indents the
thecal sac and narrows the right lateral recess but does not cause
distinct neural compression.

Small central disc protrusion at L1-2 without neural compression.

Circumferential annular bulging with annular calcification at L2-3
and L3-4. Indentation of thecal sac but no visible neural
compression.

L4-5 disc bulge and mild facet degeneration without stenosis.

Minimal disc bulge and facet degeneration at L5-S1 without stenosis.

## 2017-01-13 DIAGNOSIS — M47816 Spondylosis without myelopathy or radiculopathy, lumbar region: Secondary | ICD-10-CM | POA: Diagnosis not present

## 2017-01-26 DIAGNOSIS — M5137 Other intervertebral disc degeneration, lumbosacral region: Secondary | ICD-10-CM | POA: Diagnosis not present

## 2017-01-26 DIAGNOSIS — M47816 Spondylosis without myelopathy or radiculopathy, lumbar region: Secondary | ICD-10-CM | POA: Diagnosis not present

## 2017-01-29 DIAGNOSIS — Z23 Encounter for immunization: Secondary | ICD-10-CM | POA: Diagnosis not present

## 2017-02-11 DIAGNOSIS — M47816 Spondylosis without myelopathy or radiculopathy, lumbar region: Secondary | ICD-10-CM | POA: Diagnosis not present

## 2017-02-11 DIAGNOSIS — M5137 Other intervertebral disc degeneration, lumbosacral region: Secondary | ICD-10-CM | POA: Diagnosis not present

## 2017-02-17 DIAGNOSIS — M5137 Other intervertebral disc degeneration, lumbosacral region: Secondary | ICD-10-CM | POA: Diagnosis not present

## 2017-02-17 DIAGNOSIS — M47816 Spondylosis without myelopathy or radiculopathy, lumbar region: Secondary | ICD-10-CM | POA: Diagnosis not present

## 2017-02-25 DIAGNOSIS — M5137 Other intervertebral disc degeneration, lumbosacral region: Secondary | ICD-10-CM | POA: Diagnosis not present

## 2017-02-25 DIAGNOSIS — M47816 Spondylosis without myelopathy or radiculopathy, lumbar region: Secondary | ICD-10-CM | POA: Diagnosis not present

## 2017-03-05 DIAGNOSIS — M5137 Other intervertebral disc degeneration, lumbosacral region: Secondary | ICD-10-CM | POA: Diagnosis not present

## 2017-03-05 DIAGNOSIS — M47816 Spondylosis without myelopathy or radiculopathy, lumbar region: Secondary | ICD-10-CM | POA: Diagnosis not present

## 2017-03-11 DIAGNOSIS — M5137 Other intervertebral disc degeneration, lumbosacral region: Secondary | ICD-10-CM | POA: Diagnosis not present

## 2017-03-11 DIAGNOSIS — M47816 Spondylosis without myelopathy or radiculopathy, lumbar region: Secondary | ICD-10-CM | POA: Diagnosis not present

## 2017-03-30 DIAGNOSIS — M47816 Spondylosis without myelopathy or radiculopathy, lumbar region: Secondary | ICD-10-CM | POA: Diagnosis not present

## 2017-03-31 DIAGNOSIS — G4733 Obstructive sleep apnea (adult) (pediatric): Secondary | ICD-10-CM | POA: Diagnosis not present

## 2017-04-16 DIAGNOSIS — M5440 Lumbago with sciatica, unspecified side: Secondary | ICD-10-CM | POA: Diagnosis not present

## 2017-05-12 DIAGNOSIS — M47816 Spondylosis without myelopathy or radiculopathy, lumbar region: Secondary | ICD-10-CM | POA: Diagnosis not present

## 2017-06-17 DIAGNOSIS — E669 Obesity, unspecified: Secondary | ICD-10-CM | POA: Diagnosis not present

## 2017-06-17 DIAGNOSIS — Z136 Encounter for screening for cardiovascular disorders: Secondary | ICD-10-CM | POA: Diagnosis not present

## 2017-06-17 DIAGNOSIS — Z1389 Encounter for screening for other disorder: Secondary | ICD-10-CM | POA: Diagnosis not present

## 2017-06-17 DIAGNOSIS — Z0000 Encounter for general adult medical examination without abnormal findings: Secondary | ICD-10-CM | POA: Diagnosis not present

## 2017-06-17 DIAGNOSIS — Z23 Encounter for immunization: Secondary | ICD-10-CM | POA: Diagnosis not present

## 2017-06-17 DIAGNOSIS — Z125 Encounter for screening for malignant neoplasm of prostate: Secondary | ICD-10-CM | POA: Diagnosis not present

## 2017-06-17 DIAGNOSIS — R7309 Other abnormal glucose: Secondary | ICD-10-CM | POA: Diagnosis not present

## 2017-07-14 DIAGNOSIS — M5440 Lumbago with sciatica, unspecified side: Secondary | ICD-10-CM | POA: Diagnosis not present

## 2017-07-14 DIAGNOSIS — M5126 Other intervertebral disc displacement, lumbar region: Secondary | ICD-10-CM | POA: Diagnosis not present

## 2017-07-28 DIAGNOSIS — K5792 Diverticulitis of intestine, part unspecified, without perforation or abscess without bleeding: Secondary | ICD-10-CM | POA: Diagnosis not present

## 2017-07-28 DIAGNOSIS — K5900 Constipation, unspecified: Secondary | ICD-10-CM | POA: Diagnosis not present

## 2017-10-08 DIAGNOSIS — M5440 Lumbago with sciatica, unspecified side: Secondary | ICD-10-CM | POA: Diagnosis not present

## 2017-10-08 DIAGNOSIS — I10 Essential (primary) hypertension: Secondary | ICD-10-CM | POA: Diagnosis not present

## 2017-12-16 DIAGNOSIS — I10 Essential (primary) hypertension: Secondary | ICD-10-CM | POA: Diagnosis not present

## 2017-12-16 DIAGNOSIS — G4733 Obstructive sleep apnea (adult) (pediatric): Secondary | ICD-10-CM | POA: Diagnosis not present

## 2017-12-16 DIAGNOSIS — R7303 Prediabetes: Secondary | ICD-10-CM | POA: Diagnosis not present

## 2017-12-30 DIAGNOSIS — G4733 Obstructive sleep apnea (adult) (pediatric): Secondary | ICD-10-CM | POA: Diagnosis not present

## 2018-01-29 DIAGNOSIS — G4733 Obstructive sleep apnea (adult) (pediatric): Secondary | ICD-10-CM | POA: Diagnosis not present

## 2018-02-02 DIAGNOSIS — M79671 Pain in right foot: Secondary | ICD-10-CM | POA: Diagnosis not present

## 2018-02-02 DIAGNOSIS — M79672 Pain in left foot: Secondary | ICD-10-CM | POA: Diagnosis not present

## 2018-02-08 DIAGNOSIS — G4733 Obstructive sleep apnea (adult) (pediatric): Secondary | ICD-10-CM | POA: Diagnosis not present

## 2018-03-01 DIAGNOSIS — G4733 Obstructive sleep apnea (adult) (pediatric): Secondary | ICD-10-CM | POA: Diagnosis not present

## 2018-03-24 DIAGNOSIS — G4733 Obstructive sleep apnea (adult) (pediatric): Secondary | ICD-10-CM | POA: Diagnosis not present

## 2018-03-31 DIAGNOSIS — G4733 Obstructive sleep apnea (adult) (pediatric): Secondary | ICD-10-CM | POA: Diagnosis not present

## 2018-06-23 DIAGNOSIS — Z0000 Encounter for general adult medical examination without abnormal findings: Secondary | ICD-10-CM | POA: Diagnosis not present

## 2018-06-23 DIAGNOSIS — G4733 Obstructive sleep apnea (adult) (pediatric): Secondary | ICD-10-CM | POA: Diagnosis not present

## 2018-06-23 DIAGNOSIS — Z1389 Encounter for screening for other disorder: Secondary | ICD-10-CM | POA: Diagnosis not present

## 2018-06-23 DIAGNOSIS — I10 Essential (primary) hypertension: Secondary | ICD-10-CM | POA: Diagnosis not present

## 2018-06-23 DIAGNOSIS — R7303 Prediabetes: Secondary | ICD-10-CM | POA: Diagnosis not present

## 2018-06-23 DIAGNOSIS — Z125 Encounter for screening for malignant neoplasm of prostate: Secondary | ICD-10-CM | POA: Diagnosis not present
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
## 2022-09-19 ENCOUNTER — Encounter: Payer: Self-pay | Admitting: Cardiology

## 2022-09-19 ENCOUNTER — Ambulatory Visit: Payer: Commercial Managed Care - PPO | Attending: Cardiology | Admitting: Cardiology

## 2022-09-19 VITALS — BP 134/84 | HR 64 | Ht 70.0 in | Wt 239.2 lb

## 2022-09-19 DIAGNOSIS — R079 Chest pain, unspecified: Secondary | ICD-10-CM

## 2022-09-19 NOTE — Patient Instructions (Signed)
Medication Instructions:  No Changes In Medications at this time.    *If you need a refill on your cardiac medications before your next appointment, please call your pharmacy*  Lab Work: None Ordered At This Time.   If you have labs (blood work) drawn today and your tests are completely normal, you will receive your results only by: MyChart Message (if you have MyChart) OR A paper copy in the mail If you have any lab test that is abnormal or we need to change your treatment, we will call you to review the results.  Testing/Procedures: Your physician has requested that you have an exercise tolerance test. For further information please visit [email or web address] Please also follow instruction sheet, as given.  Your physician has requested that you have an exercise tolerance test.  Please also follow instruction sheet, as given. This will take place at [address] Do not drink or eat foods with caffeine for 24 hours before the test. (Chocolate, coffee, tea, or energy drinks) If you use an inhaler, bring it with you to the test. Do not smoke for 4 hours before the test. Wear comfortable shoes and clothing.   Dr. Antoine Poche has ordered a CT coronary calcium score.   Test locations:  [facility]  [facility] at [facility]  This is $99 out of pocket.   Coronary CalciumScan A coronary calcium scan is an imaging test used to look for deposits of calcium and other fatty materials (plaques) in the inner lining of the blood vessels of the heart (coronary arteries). These deposits of calcium and plaques can partly clog and narrow the coronary arteries without producing any symptoms or warning signs. This puts a person at risk for a heart attack. This test can detect these deposits before symptoms develop. Tell a health care provider about: Any allergies you have. All medicines you are taking, including  vitamins, herbs, eye drops, creams, and over-the-counter medicines. Any problems you or family members have had with anesthetic medicines. Any blood disorders you have. Any surgeries you have had. Any medical conditions you have. Whether you are pregnant or may be pregnant. What are the risks? Generally, this is a safe procedure. However, problems may occur, including: Harm to a pregnant woman and her unborn baby. This test involves the use of radiation. Radiation exposure can be dangerous to a pregnant woman and her unborn baby. If you are pregnant, you generally should not have this procedure done. Slight increase in the risk of cancer. This is because of the radiation involved in the test. What happens before the procedure? No preparation is needed for this procedure. What happens during the procedure? You will undress and remove any jewelry around your neck or chest. You will put on a hospital gown. Sticky electrodes will be placed on your chest. The electrodes will be connected to an electrocardiogram (ECG) machine to record a tracing of the electrical activity of your heart. A CT scanner will take pictures of your heart. During this time, you will be asked to lie still and hold your breath for 2-3 seconds while a picture of your heart is being taken. The procedure may vary among health care providers and hospitals. What happens after the procedure? You can get dressed. You can return to your normal activities. It is up to you to get the results of your test. Ask your health care provider, or the department that is doing the test,  when your results will be ready. Summary A coronary calcium scan is an imaging test used to look for deposits of calcium and other fatty materials (plaques) in the inner lining of the blood vessels of the heart (coronary arteries). Generally, this is a safe procedure. Tell your health care provider if you are pregnant or may be pregnant. No preparation is  needed for this procedure. A CT scanner will take pictures of your heart. You can return to your normal activities after the scan is done. This information is not intended to replace advice given to you by your health care provider. Make sure you discuss any questions you have with your health care provider. Document Released: 10/11/2007 Document Revised: 03/03/2016 Document Reviewed: 03/03/2016 Elsevier Interactive Patient Education  2017 [employer].  Follow-Up: At [facility], you and your health needs are our priority.  As part of our continuing mission to provide you with exceptional heart care, we have created designated Provider Care Teams.  These Care Teams include your primary Cardiologist (physician) and Advanced Practice Providers (APPs -  Physician Assistants and Nurse Practitioners) who all work together to provide you with the care you need, when you need it.  Your next appointment:   AS NEEDED   Provider:   Rollene Rotunda, MD

## 2022-09-23 ENCOUNTER — Other Ambulatory Visit (HOSPITAL_COMMUNITY): Payer: Self-pay

## 2022-09-30 ENCOUNTER — Ambulatory Visit (HOSPITAL_COMMUNITY): Payer: Commercial Managed Care - PPO

## 2022-10-02 ENCOUNTER — Telehealth (HOSPITAL_COMMUNITY): Payer: Self-pay | Admitting: *Deleted

## 2022-10-02 NOTE — Telephone Encounter (Signed)
Pt reached and given instructions for ETT.

## 2022-10-09 ENCOUNTER — Ambulatory Visit (HOSPITAL_COMMUNITY): Payer: Commercial Managed Care - PPO | Attending: Cardiology

## 2022-10-09 DIAGNOSIS — R079 Chest pain, unspecified: Secondary | ICD-10-CM | POA: Insufficient documentation

## 2022-10-09 LAB — EXERCISE TOLERANCE TEST
Estimated workload: 8.5
Exercise duration (min): 7 min
Exercise duration (sec): 1 s
Rest HR: 70 {beats}/min

## 2022-10-13 LAB — EXERCISE TOLERANCE TEST
Angina Index: 0
MPHR: 161 {beats}/min
Peak HR: 169 {beats}/min
Percent HR: 104 %
RPE: 18

## 2022-11-03 ENCOUNTER — Ambulatory Visit (HOSPITAL_BASED_OUTPATIENT_CLINIC_OR_DEPARTMENT_OTHER): Payer: Commercial Managed Care - PPO

## 2022-11-12 ENCOUNTER — Ambulatory Visit (HOSPITAL_BASED_OUTPATIENT_CLINIC_OR_DEPARTMENT_OTHER)
Admission: RE | Admit: 2022-11-12 | Discharge: 2022-11-12 | Disposition: A | Discharge status: Home / Self Care | Payer: Commercial Managed Care - PPO | Source: Ambulatory Visit | Attending: Cardiology | Admitting: Cardiology

## 2022-11-12 DIAGNOSIS — R079 Chest pain, unspecified: Secondary | ICD-10-CM | POA: Insufficient documentation

## 2023-01-01 NOTE — Progress Notes (Signed)
  Cardiology Office Note:   Date:  01/02/2023  ID:  Adam Hubbard, DOB [birth year], MRN [id number] PCP: Georgann Housekeeper, MD  [facility] Providers Cardiologist:  Rollene Rotunda, MD {  History of Present Illness:   Adam Hubbard is a 62 y.o. male who presents for evaluation of an abnormal EKG.  He is referred by Georgann Housekeeper, MD  EKG demonstrated interventricular conduction delay with possible old anteroseptal infarct.  This was new compared to an old EKG from 2018.  Interestingly today's EKG looks exactly like it did in 2018 and is normal.  He went to see his primary provider because he was having chest discomfort  and had a slightly abnormal CXR when I saw him.  I sent him for a POET (Plain Old Exercise Treadmill) which had no high risk features.  He had an intermittent LBBB.    Since I last saw him he is continue to have some chest soreness occasionally.  It happens if he is moving in a certain way or sometimes exerting himself in a certain way.  He denies any associated nausea vomiting or diaphoresis.  He is not having any new shortness of breath, PND or orthopnea.  He said no new weight gain or edema.  ROS: As stated in the HPI and negative for all other systems.  Studies Reviewed:    EKG:   NA  Risk Assessment/Calculations:          Physical Exam:   VS:  BP (!) 154/92 (BP Location: Left Arm, Patient Position: Sitting, Cuff Size: Large)   Pulse 74   Ht 5\' 10"  (1.778 m)   Wt 239 lb (108.4 kg)   SpO2 95%   BMI 34.29 kg/m    Wt Readings from Last 3 Encounters:  01/02/23 239 lb (108.4 kg)  09/19/22 239 lb 3.2 oz (108.5 kg)  09/08/16 235 lb (106.6 kg)     GEN: Well nourished, well developed in no acute distress NECK: No JVD; No carotid bruits CARDIAC: RRR, no murmurs, rubs, gallops RESPIRATORY:  wheezing or rhonchi  ABDOMEN: Soft, non-tender, non-distended EXTREMITIES:  No edema; No deformity   ASSESSMENT AND PLAN:   Abnormal EKG/chest pain:   He had an abnormal   POET (Plain Old Exercise Treadmill).  He had zero calcium score.  He had conduction disturbance during his stress test that made the test uninterpretable.  He still having symptoms.  Therefore, I am going to proceed with a Lexiscan Myoview.  This is intermediate risk.  He is not a treadmill candidate since he had the uninterpretable result.  Hyperglycemia: A1c was 5.9 we again talked about diet.  Risk factor  Sleep apnea:  He uses CPAP        Follow up with me as needed.    Signed, Rollene Rotunda, MD

## 2023-01-02 ENCOUNTER — Encounter (HOSPITAL_COMMUNITY): Payer: Self-pay

## 2023-01-02 ENCOUNTER — Encounter: Payer: Self-pay | Admitting: Cardiology

## 2023-01-02 ENCOUNTER — Ambulatory Visit: Payer: Commercial Managed Care - PPO | Attending: Cardiology | Admitting: Cardiology

## 2023-01-02 VITALS — BP 154/92 | HR 74 | Ht 70.0 in | Wt 239.0 lb

## 2023-01-02 DIAGNOSIS — R079 Chest pain, unspecified: Secondary | ICD-10-CM | POA: Diagnosis not present

## 2023-01-02 NOTE — Patient Instructions (Signed)
Medication Instructions:  Your physician recommends that you continue on your current medications as directed. Please refer to the Current Medication list given to you today.  *If you need a refill on your cardiac medications before your next appointment, please call your pharmacy*   Lab Work: None   Testing/Procedures: Your physician has requested that you have a lexiscan myoview. For further information please visit [email or web address] Please follow instruction sheet, as given.   The test will take approximately 3 to 4 hours to complete; you may bring reading material.  If someone comes with you to your appointment, they will need to remain in the main lobby due to limited space in the testing area. **If you are pregnant or breastfeeding, please notify the nuclear lab prior to your appointment**  How to prepare for your Myocardial Perfusion Test: Do not eat or drink 3 hours prior to your test, except you may have water. Do not consume products containing caffeine (regular or decaffeinated) 12 hours prior to your test. (ex: coffee, chocolate, sodas, tea). Do bring a list of your current medications with you.  If not listed below, you may take your medications as normal. Do wear comfortable clothes (no dresses or overalls) and walking shoes, tennis shoes preferred (No heels or open toe shoes are allowed). Do NOT wear cologne, perfume, aftershave, or lotions (deodorant is allowed). If these instructions are not followed, your test will have to be rescheduled.    Follow-Up: At [facility], you and your health needs are our priority.  As part of our continuing mission to provide you with exceptional heart care, we have created designated Provider Care Teams.  These Care Teams include your primary Cardiologist (physician) and Advanced Practice Providers (APPs -  Physician Assistants and Nurse Practitioners) who all work together to provide you with the care you need, when you need  it.  Your next appointment:    As needed  Provider:   Rollene Rotunda, MD

## 2023-01-09 ENCOUNTER — Ambulatory Visit (HOSPITAL_COMMUNITY): Payer: Commercial Managed Care - PPO | Attending: Cardiology

## 2023-01-09 DIAGNOSIS — R079 Chest pain, unspecified: Secondary | ICD-10-CM | POA: Diagnosis present

## 2023-01-09 LAB — MYOCARDIAL PERFUSION IMAGING
LV dias vol: 78 mL (ref 62–150)
LV sys vol: 32 mL
Nuc Stress EF: 59 %
Peak HR: 96 {beats}/min
Rest HR: 59 {beats}/min
Rest Nuclear Isotope Dose: 10.9 mCi
SDS: 2
SRS: 0
SSS: 2
ST Depression (mm): 0 mm
Stress Nuclear Isotope Dose: 31.4 mCi
TID: 0.92

## 2023-01-09 MED ORDER — REGADENOSON 0.4 MG/5ML IV SOLN
0.4000 mg | Freq: Once | INTRAVENOUS | Status: AC
  Administered 2023-01-09: 0.4 mg via INTRAVENOUS

## 2023-01-09 MED ORDER — TECHNETIUM TC 99M TETROFOSMIN IV KIT
31.4000 | PACK | Freq: Once | INTRAVENOUS | Status: AC | PRN
  Administered 2023-01-09: 31.4 via INTRAVENOUS

## 2023-01-09 MED ORDER — TECHNETIUM TC 99M TETROFOSMIN IV KIT
10.9000 | PACK | Freq: Once | INTRAVENOUS | Status: AC | PRN
  Administered 2023-01-09: 10.9 via INTRAVENOUS
